# Patient Record
Sex: Female | Born: 1985 | Race: White | Hispanic: No | Marital: Single | Smoking: Former smoker
Health system: Southern US, Community
[De-identification: ages and names within clinical notes are randomized; demographics above are authoritative.]

## PROBLEM LIST (undated history)

## (undated) DIAGNOSIS — O903 Peripartum cardiomyopathy: Secondary | ICD-10-CM

## (undated) DIAGNOSIS — I10 Essential (primary) hypertension: Secondary | ICD-10-CM

## (undated) DIAGNOSIS — R079 Chest pain, unspecified: Secondary | ICD-10-CM

## (undated) DIAGNOSIS — L732 Hidradenitis suppurativa: Secondary | ICD-10-CM

## (undated) DIAGNOSIS — F419 Anxiety disorder, unspecified: Secondary | ICD-10-CM

## (undated) DIAGNOSIS — R002 Palpitations: Secondary | ICD-10-CM

## (undated) DIAGNOSIS — I5022 Chronic systolic (congestive) heart failure: Secondary | ICD-10-CM

## (undated) HISTORY — PX: OTHER SURGICAL HISTORY: SHX169

## (undated) HISTORY — DX: Chest pain, unspecified: R07.9

## (undated) HISTORY — DX: Chronic systolic (congestive) heart failure: I50.22

## (undated) HISTORY — DX: Essential (primary) hypertension: I10

## (undated) HISTORY — DX: Hidradenitis suppurativa: L73.2

## (undated) HISTORY — DX: Peripartum cardiomyopathy: O90.3

## (undated) HISTORY — DX: Palpitations: R00.2

## (undated) HISTORY — PX: DILATION AND CURETTAGE, DIAGNOSTIC / THERAPEUTIC: SUR384

---

## 2007-10-05 ENCOUNTER — Emergency Department (HOSPITAL_COMMUNITY): Admission: EM | Admit: 2007-10-05 | Discharge: 2007-10-05 | Payer: Self-pay | Admitting: Emergency Medicine

## 2008-05-13 ENCOUNTER — Inpatient Hospital Stay (HOSPITAL_COMMUNITY): Admission: AD | Admit: 2008-05-13 | Discharge: 2008-05-13 | Payer: Self-pay | Admitting: Obstetrics & Gynecology

## 2009-08-21 ENCOUNTER — Emergency Department (HOSPITAL_COMMUNITY): Admission: EM | Admit: 2009-08-21 | Discharge: 2009-08-21 | Payer: Self-pay | Admitting: Emergency Medicine

## 2009-08-21 ENCOUNTER — Ambulatory Visit: Payer: Self-pay | Admitting: Vascular Surgery

## 2009-08-21 ENCOUNTER — Encounter (INDEPENDENT_AMBULATORY_CARE_PROVIDER_SITE_OTHER): Payer: Self-pay | Admitting: Emergency Medicine

## 2009-10-31 ENCOUNTER — Inpatient Hospital Stay (HOSPITAL_COMMUNITY): Admission: AD | Admit: 2009-10-31 | Discharge: 2009-10-31 | Payer: Self-pay | Admitting: Obstetrics and Gynecology

## 2009-11-04 ENCOUNTER — Inpatient Hospital Stay (HOSPITAL_COMMUNITY): Admission: AD | Admit: 2009-11-04 | Discharge: 2009-11-07 | Payer: Self-pay | Admitting: Obstetrics and Gynecology

## 2009-11-18 ENCOUNTER — Ambulatory Visit (HOSPITAL_COMMUNITY): Admission: RE | Admit: 2009-11-18 | Discharge: 2009-11-18 | Payer: Self-pay | Admitting: Obstetrics and Gynecology

## 2009-11-18 ENCOUNTER — Ambulatory Visit: Payer: Self-pay | Admitting: Internal Medicine

## 2009-11-18 ENCOUNTER — Inpatient Hospital Stay (HOSPITAL_COMMUNITY): Admission: EM | Admit: 2009-11-18 | Discharge: 2009-11-21 | Payer: Self-pay | Admitting: Emergency Medicine

## 2009-11-19 ENCOUNTER — Encounter (INDEPENDENT_AMBULATORY_CARE_PROVIDER_SITE_OTHER): Payer: Self-pay | Admitting: Cardiovascular Disease

## 2009-11-21 ENCOUNTER — Encounter: Payer: Self-pay | Admitting: Cardiology

## 2009-11-22 ENCOUNTER — Telehealth: Payer: Self-pay | Admitting: Cardiology

## 2009-12-02 ENCOUNTER — Ambulatory Visit: Payer: Self-pay | Admitting: Cardiology

## 2009-12-02 DIAGNOSIS — I429 Cardiomyopathy, unspecified: Secondary | ICD-10-CM | POA: Insufficient documentation

## 2009-12-02 DIAGNOSIS — I10 Essential (primary) hypertension: Secondary | ICD-10-CM

## 2009-12-13 LAB — CONVERTED CEMR LAB
BUN: 23 mg/dL (ref 6–23)
Calcium: 10.2 mg/dL (ref 8.4–10.5)
Creatinine, Ser: 0.81 mg/dL (ref 0.40–1.20)

## 2009-12-26 ENCOUNTER — Ambulatory Visit: Payer: Self-pay | Admitting: Cardiology

## 2009-12-26 DIAGNOSIS — I5022 Chronic systolic (congestive) heart failure: Secondary | ICD-10-CM

## 2010-01-25 ENCOUNTER — Telehealth (INDEPENDENT_AMBULATORY_CARE_PROVIDER_SITE_OTHER): Payer: Self-pay | Admitting: *Deleted

## 2010-01-31 ENCOUNTER — Encounter: Payer: Self-pay | Admitting: Internal Medicine

## 2010-01-31 ENCOUNTER — Ambulatory Visit (HOSPITAL_COMMUNITY): Admission: RE | Admit: 2010-01-31 | Discharge: 2010-01-31 | Payer: Self-pay | Admitting: Internal Medicine

## 2010-01-31 ENCOUNTER — Telehealth: Payer: Self-pay | Admitting: Cardiology

## 2010-01-31 DIAGNOSIS — R079 Chest pain, unspecified: Secondary | ICD-10-CM

## 2010-01-31 DIAGNOSIS — R0602 Shortness of breath: Secondary | ICD-10-CM

## 2010-02-16 ENCOUNTER — Ambulatory Visit: Payer: Self-pay

## 2010-02-16 ENCOUNTER — Ambulatory Visit: Payer: Self-pay | Admitting: Cardiovascular Disease

## 2010-02-16 ENCOUNTER — Ambulatory Visit: Payer: Self-pay | Admitting: Cardiology

## 2010-02-16 ENCOUNTER — Encounter: Payer: Self-pay | Admitting: Cardiology

## 2010-02-16 ENCOUNTER — Ambulatory Visit (HOSPITAL_COMMUNITY): Admission: RE | Admit: 2010-02-16 | Discharge: 2010-02-16 | Payer: Self-pay | Admitting: Cardiology

## 2010-02-16 DIAGNOSIS — R002 Palpitations: Secondary | ICD-10-CM

## 2010-02-23 ENCOUNTER — Telehealth: Payer: Self-pay | Admitting: Cardiology

## 2010-02-27 ENCOUNTER — Telehealth: Payer: Self-pay | Admitting: Cardiology

## 2010-03-09 ENCOUNTER — Encounter: Payer: Self-pay | Admitting: Cardiology

## 2010-03-09 ENCOUNTER — Telehealth: Payer: Self-pay | Admitting: Cardiology

## 2010-03-13 ENCOUNTER — Encounter: Payer: Self-pay | Admitting: Cardiology

## 2010-04-25 ENCOUNTER — Ambulatory Visit (HOSPITAL_COMMUNITY): Admission: RE | Admit: 2010-04-25 | Discharge: 2010-04-25 | Payer: Self-pay | Admitting: Obstetrics and Gynecology

## 2010-08-22 ENCOUNTER — Encounter: Payer: Self-pay | Admitting: Cardiology

## 2010-08-22 ENCOUNTER — Ambulatory Visit (HOSPITAL_COMMUNITY)
Admission: RE | Admit: 2010-08-22 | Discharge: 2010-08-22 | Payer: Self-pay | Source: Home / Self Care | Attending: Cardiology | Admitting: Cardiology

## 2010-08-22 ENCOUNTER — Ambulatory Visit: Payer: Self-pay

## 2010-08-22 ENCOUNTER — Ambulatory Visit: Payer: Self-pay | Admitting: Cardiology

## 2010-08-24 ENCOUNTER — Telehealth: Payer: Self-pay | Admitting: Cardiology

## 2010-09-08 ENCOUNTER — Other Ambulatory Visit: Payer: Self-pay | Admitting: Cardiology

## 2010-09-08 ENCOUNTER — Ambulatory Visit
Admission: RE | Admit: 2010-09-08 | Discharge: 2010-09-08 | Payer: Self-pay | Source: Home / Self Care | Attending: Cardiology | Admitting: Cardiology

## 2010-09-08 LAB — BASIC METABOLIC PANEL
BUN: 14 mg/dL (ref 6–23)
CO2: 26 mEq/L (ref 19–32)
Calcium: 9.3 mg/dL (ref 8.4–10.5)
Chloride: 102 mEq/L (ref 96–112)
Creatinine, Ser: 0.7 mg/dL (ref 0.4–1.2)
GFR: 112.87 mL/min (ref >60.00)
Glucose, Bld: 122 mg/dL — ABNORMAL HIGH (ref 70–99)
Potassium: 4.3 mEq/L (ref 3.5–5.1)
Sodium: 137 mEq/L (ref 135–145)

## 2010-09-11 ENCOUNTER — Telehealth: Payer: Self-pay | Admitting: Cardiology

## 2010-09-13 ENCOUNTER — Encounter: Payer: Self-pay | Admitting: Cardiology

## 2010-09-24 ENCOUNTER — Encounter: Payer: Self-pay | Admitting: Cardiology

## 2010-10-03 NOTE — Medication Information (Signed)
Summary: CTA of Chest  CTA of Chest   Imported By: Debby Freiberg 03/01/2010 09:52:34  _____________________________________________________________________  External Attachment:    Type:   Image     Comment:   External Document

## 2010-10-03 NOTE — Miscellaneous (Signed)
Summary: Orders Update  Clinical Lists Changes  Orders: Added new Referral order of CT Scan  (CT Scan) - Signed  Appended Document: Orders Update CT negative. Reassurance.  Reviewed Juanito Doom, MD

## 2010-10-03 NOTE — Assessment & Plan Note (Signed)
Summary: rov pt having echo at 3pm today/sl   Visit Type:  4 mo f/u Referring Provider:  Dr Jackelyn Knife Primary Provider:  Dr Jackelyn Knife  CC:  palpitations....denies any cp or edema or sob.  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  Since her last visit, she has gained some strength. She still gets a little winded when she pushes herself too hard. She has palpitations that are intermittent and not predictable. They occur usually at rest. She is on a syncope or presyncope. She denies any chest pain. She has had no orthopnea, PND, or edema.    Current Medications (verified): 1)  Coreg 6.25 Mg Tabs (Carvedilol) .[name].. 1 Two Times A Day 2)  Furosemide 20 Mg Tabs (Furosemide) .... Take One Tablet By Mouth Daily. 3)  Lisinopril 10 Mg Tabs (Lisinopril) .[name].. 1 Once Daily 4)  Tetracycline Hcl 500 Mg Caps (Tetracycline Hcl) .[name].. 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   Risk Factors: Exercise: no (12/02/2009)  Risk Factors: Smoking Status: never (12/02/2009)  Review of Systems       negative history of present illness  Vital Signs:  Patient profile:   25 year old female Height:      69 inches Weight:      162 pounds BMI:      24.01 Pulse rate:   77 / minute Pulse rhythm:   regular BP sitting:   106 / 60  (left arm) Cuff size:   large  Vitals Entered By: Danielle Rankin, CMA (February 16, 2010 4:11 PM)  Physical Exam  General:  Well developed, well nourished, in no acute distress. Head:  normocephalic and atraumatic Eyes:  PERRLA/EOM intact; conjunctiva and lids normal. Neck:  Neck supple, no JVD. No masses, thyromegaly or abnormal cervical nodes. Chest Shelby Smith:  no deformities or breast masses noted Lungs:  Clear bilaterally to auscultation and percussion. Heart:  and her PMI nondisplaced, regular rate and rhythm, normal S1-S2, soft systolic murmur at the left lower sternal border, no gallop Msk:  Back normal, normal gait. Muscle strength and tone normal. Pulses:  pulses normal in all 4 extremities Extremities:  No clubbing or cyanosis. Neurologic:  Alert and oriented x 3. Skin:  Intact without lesions or rashes. Psych:  Normal affect.   Problems:  Medical Problems Added: 1)  Dx of Palpitations  (ICD-785.1)  Impression & Recommendations:  Problem # 1:  CARDIOMYOPATHY, SECONDARY (ICD-425.9) Assessment Improved Preliminary echocardiogram shows the heart is now normal in size. This is encouraging. Final read is pending.  Hopefully, her ejection fraction has improved. Her updated medication list for this problem includes:    Coreg 6.25 Mg Tabs (Carvedilol) .[name]... 1 two times a day    Furosemide 20 Mg Tabs (Furosemide) .[name]... Take one tablet by mouth daily.    Lisinopril 10 Mg Tabs (Lisinopril) .Marland KitchenMarland KitchenMarland KitchenMarland Kitchen 1  once daily  Problem # 2:  HYPERTENSION, UNSPECIFIED (ICD-401.9) Assessment: Improved  Her updated medication list for this problem includes:    Coreg 6.25 Mg Tabs (Carvedilol) .[name]... 1 two times a day    Furosemide 20 Mg Tabs (Furosemide) .[name]... Take one tablet by mouth daily.    Lisinopril 10 Mg Tabs (Lisinopril) .[name]... 1 once daily  Problem # 3:  PALPITATIONS (ICD-785.1) with her history of a cardiomyopathy,  will obtain an event recorder to any significant arrhythmia. Electrolytes have been normal in the past so I'll not repeat those. Her updated medication list for this problem includes:    Coreg 6.25 Mg Tabs (Carvedilol) .[name]... 1 two times a day    Lisinopril 10 Mg Tabs (Lisinopril) .[name]... 1 once daily  Orders: EKG w/ Interpretation (93000) Event (Event)  Patient Instructions: 1)  Your physician recommends that you schedule a follow-up appointment in: 6 MONTHS WITH DR Aaren Atallah 2)  Your physician recommends that you continue on your current medications as directed. Please refer to the Current Medication list given to you today. 3)  Your physician has recommended that you wear an event monitor.  Event monitors are medical devices that record the heart's electrical activity. Doctors most often use these monitors to diagnose arrhythmias. Arrhythmias are problems with the speed or rhythm of the heartbeat. The monitor is a small, portable device. You can wear one while you do your normal daily activities. This is usually used to diagnose what is causing palpitations/syncope (passing out).

## 2010-10-03 NOTE — Assessment & Plan Note (Signed)
Summary: eph/ gd   Visit Type:  post hospital Referring Provider:  Dr Jackelyn Knife Primary Provider:  Dr Jackelyn Knife  CC:  palpitations.  History of Present Illness: [name] comes in today for evaluation and management of a postpartum cardiomyopathy.  Please see the recent history and physical and discharge summary.  Since discharge, she denies orthopnea, PND, dyspnea on exertion. She is having significant fatigue. In addition she's had headaches which are generalized. She says she's had these ever since the labetalol was injected in the hospital. These have not stopped since she went home.  She's had some heart fluttering but no presyncope or syncope. These most commonly happened at night when she lies in bed.  Her baby Shelby Smith is doing remarkably well. She brought her to the clinic today.  Preventive Screening-Counseling & Management  Alcohol-Tobacco     Smoking Status: never  Caffeine-Diet-Exercise     Does Patient Exercise: no  Current Medications (verified): 1)  Labetalol Hcl 200 Mg Tabs (Labetalol Hcl) .... Take One Tablet By Mouth Twice A Day 2)  Furosemide 20 Mg Tabs (Furosemide) .... Take One Tablet By Mouth Daily. 3)  Multivitamins   Tabs (Multiple Vitamin) .... 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   Risk Factors: Exercise: no (12/02/2009)  Risk Factors: Smoking Status: never (12/02/2009)  Past Surgical History: D&C R ear surgery (eardrum  repair) Rt kneww lateral release  Social History: Full Time Alcohol Use - yes -- rarely Drug Use - no Single  Regular Exercise - no Tobacco Use - No.  Does Patient Exercise:  no Smoking Status:  never  Review of Systems       negative other than history of present illness  Vital Signs:  Patient profile:   25 year old female Height:      69 inches Weight:      158 pounds BMI:     23.42 Pulse rate:   65 / minute BP sitting:   104 / 76  (left arm) Cuff size:   regular  Vitals Entered By: Hardin Negus, RMA (December 02, 2009 3:05 PM)  Physical Exam  General:  Well developed, well nourished, in no acute distress. she looks very tired Head:  normocephalic and atraumatic Eyes:  PERRLA/EOM intact; conjunctiva and lids normal. Neck:  Neck supple, no JVD. No masses, thyromegaly or abnormal cervical nodes. Chest Regan Mcbryar:  no deformities or breast masses noted Lungs:  Clear bilaterally to auscultation and percussion. Heart:  PMI displaced inferolaterally, regular rate and rhythm, no gallop or rub Abdomen:  Bowel sounds positive; abdomen soft and non-tender without masses, organomegaly, or hernias noted. No hepatosplenomegaly. Msk:  Back normal, normal gait. Muscle strength and tone normal. Pulses:  pulses normal in all 4 extremities Extremities:  No clubbing or cyanosis. Neurologic:  Alert and oriented x 3. Skin:  Intact without lesions or rashes. Psych:  Normal affect.   Impression & Recommendations:  Problem # 1:  CARDIOMYOPATHY, SECONDARY (ICD-425.9) Assessment Unchanged At the present time she is compensated. She is on an inappropriate program because  her breast-feeding. After long talk with her today about the importance of taking care of herself first, so she can take care of Valorie, she has decided to let me change her medications and stop breast-feeding.  We'll discontinue labetalol. We'll continue furosemide for now. I Will begancarvedilol 6.25 mg p.o. b.i.d. and  low-dose lisinopril 10 mg per day. We will check a set of electrolytes today. Salt restriction again emphasized.I will see her back for close followup in 4 weeks. We will repeat a 2-D echocardiogram 3 months after her presentation. Her updated medication list for this problem includes:    Coreg 6.25 Mg Tabs (Carvedilol) .[name]... 1 two times a day    Furosemide 20 Mg Tabs (Furosemide) .[name]... Take one tablet by mouth daily.    Lisinopril 10 Mg Tabs (Lisinopril) .[name]... 1 once daily  Problem # 2:  HYPERTENSION, UNSPECIFIED (ICD-401.9) Assessment: Improved  Her updated medication list for this problem includes:    Coreg 6.25 Mg Tabs (Carvedilol) .[name]... 1 two times a day    Furosemide 20 Mg Tabs (Furosemide) .[name]... Take one tablet by mouth daily.    Lisinopril 10 Mg Tabs (Lisinopril) .[name]... 1 once daily  Her updated medication list for this problem includes:    Labetalol Hcl 200 Mg Tabs (Labetalol hcl) .[name]... Take one tablet by mouth twice a day    Furosemide 20 Mg Tabs (Furosemide) .[name]... Take one tablet by mouth daily.  Problem # 3:  SYSTOLIC HEART FAILURE, ACUTE (ICD-428.21) Assessment: Improved  Her updated medication list for this problem includes:    Coreg 6.25 Mg Tabs (Carvedilol) .[name]... 1 two times a day    Furosemide 20 Mg Tabs (Furosemide) .[name]... Take one tablet by mouth daily.    Lisinopril 10 Mg Tabs (Lisinopril) .[name]... 1 once daily  Her updated medication list for this problem includes:    Labetalol Hcl 200 Mg Tabs (Labetalol hcl) .[name]... Take one tablet by mouth twice a day    Furosemide 20 Mg Tabs (Furosemide) .[name]... Take one tablet by mouth daily.  Orders: TLB-BMP (Basic Metabolic Panel-BMET) (80048-METABOL)  Patient Instructions: 1)  Your physician recommends that you schedule a follow-up appointment in: 4 WEEKS WITH DR Mariana Wiederholt 2)  Your physician recommends that you return for lab work VO:ZDGUY BMET 425.9 3)  Your physician has recommended you make the following change in your medication:  STOP LABATELOL 4)  START CARVEDILOL 6.25 MG two times a day  5)  LISINOPRIL 10 MG once daily Prescriptions: LISINOPRIL 10 MG TABS (LISINOPRIL) 1 once daily  #30 x 11   Entered by:   Scherrie Bateman, LPN   Authorized by:   Gaylord Shih, MD, [facility]   Signed by:   Scherrie Bateman, LPN on 40/34/7425   Method used:   Electronically to        [facility]  [address] [phone]* (retail)       [address] [place], [place]  [address]       Ph: [phone] or [phone]       Fax: [phone]   RxID:   [id number] COREG 6.25 MG TABS (CARVEDILOL) 1 two times a day  #60 x 11   Entered by:   Scherrie Bateman, LPN   Authorized by:   Gaylord Shih, MD, [facility]   Signed by:   Scherrie Bateman, LPN on 15/17/6160   Method used:   Electronically to  [facility]  [address] [phone]* (retail)       [address]       [place], [place]  [address]       Ph: [phone] or [phone]       Fax: [phone]   RxID:   [id number]

## 2010-10-03 NOTE — Assessment & Plan Note (Signed)
Summary: per check out/sf   Referring Provider:  Dr Jackelyn Knife Primary Provider:  Dr Jackelyn Knife   History of Present Illness: Shelby Smith returns today for evaluation and management of her secondary cardiomyopathy.  She's tolerating the lisinopril and carvedilol which restart her last visit. She occasionally has some orthostatic symptoms but is rare. She does get short of breath and develops some tachycardia symptoms when she goes up hill. She denies orthopnea or PND. She's had no peripheral edema. He states he very compliant with her medications.  Current Medications (verified): 1)  Coreg 6.25 Mg Tabs (Carvedilol) .[name].. 1 Two Times A Day 2)  Furosemide 20 Mg Tabs (Furosemide) .... Take One Tablet By Mouth Daily. 3)  Multivitamins   Tabs (Multiple Vitamin) .... Once Daily 4)  Lisinopril 10 Mg Tabs (Lisinopril) .[name].. 1 Once Daily 5)  Tetracycline Hcl 500 Mg Caps (Tetracycline Hcl) .[name].. 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   Risk Factors: Exercise: no (12/02/2009)  Risk Factors: Smoking Status: never (12/02/2009)  Review of Systems       negative history of present illness  Vital Signs:  Patient profile:   25 year old female Height:      69 inches Weight:      161 pounds Pulse rate:   77 / minute BP sitting:   100 / 60  (left arm)  Vitals  Entered By: Laurance Flatten CMA (December 26, 2009 4:32 PM)  Physical Exam  General:  mildly overweight, in no acute distress Head:  normocephalic and atraumatic Eyes:  PERRLA/EOM intact; conjunctiva and lids normal. Neck:  Neck supple, no JVD. No masses, thyromegaly or abnormal cervical nodes. Chest Vennie Waymire:  no deformities or breast masses noted Lungs:  Clear bilaterally to auscultation and percussion. Heart:  nS1-S2, no gallop, regular rate and rhythm Msk:  Back normal, normal gait. Muscle strength and tone normal. Pulses:  pulses normal in all 4 extremities Extremities:  No clubbing or cyanosis. Neurologic:  Alert and oriented x 3. Skin:  Intact without lesions or rashes. Psych:  Normal affect.   Problems:  Medical Problems Added: 1)  Dx of Systolic Heart Failure, Chronic  (ICD-428.22)  Impression & Recommendations:  Problem # 1:  CARDIOMYOPATHY, SECONDARY (ICD-425.9) She is stable and gaining strength and stamina. I suspect her LV function has improved. She still lightheaded with standing. Will not change her medications today. I will repeat her echocardiogram in June. Hopefully, we'll see a significant improvement in LV function. If not, will add spironolactone. Her updated medication list for this problem includes:    Coreg 6.25 Mg Tabs (Carvedilol) .[name]... 1 two times a day    Furosemide 20 Mg Tabs (Furosemide) .[name]... Take one tablet by mouth daily.    Lisinopril 10 Mg Tabs (Lisinopril) .[name]... 1 once daily  Problem # 2:  HYPERTENSION, UNSPECIFIED (ICD-401.9) Assessment: Improved  Her updated medication list for this problem includes:    Coreg 6.25 Mg Tabs (  Carvedilol) .[name]... 1 two times a day    Furosemide 20 Mg Tabs (Furosemide) .[name]... Take one tablet by mouth daily.    Lisinopril 10 Mg Tabs (Lisinopril) .[name]... 1 once daily  Problem # 3:  SYSTOLIC HEART FAILURE, CHRONIC (ICD-428.22) Assessment: Unchanged  Her updated medication list for this problem includes:    Coreg 6.25 Mg  Tabs (Carvedilol) .[name]... 1 two times a day    Furosemide 20 Mg Tabs (Furosemide) .[name]... Take one tablet by mouth daily.    Lisinopril 10 Mg Tabs (Lisinopril) .[name]... 1 once daily  Orders: Echocardiogram (Echo)  Patient Instructions: 1)  Your physician recommends that you schedule a follow-up appointment in: JUNE 2011 ECHO SAME DAY  2)  Your physician recommends that you continue on your current medications as directed. Please refer to the Current Medication list given to you today. 3)  Your physician has requested that you have an echocardiogram.  Echocardiography is a painless test that uses sound waves to create images of your heart. It provides your doctor with information about the size and shape of your heart and how well your heart's chambers and valves are working.  This procedure takes approximately one hour. There are no restrictions for this procedure.SEE DR Naasir Carreira SAME DAY

## 2010-10-03 NOTE — Progress Notes (Signed)
Summary: pt having headaches  Phone Note Call from Patient Call back at [phone]   Caller: Patient Reason for Call: Talk to Nurse, Talk to Doctor Summary of Call: pt is still having headaches and was wondering what was gonna be done about it Initial call taken by: Omer Jack,  November 22, 2009 3:36 PM  Follow-up for Phone Call        lmtcb Scherrie Bateman, LPN  November 22, 2009 3:40 PM  per pt calling back [phone] Lorne Skeens  November 22, 2009 3:44 PM  PT C/O H/A SINCE STARTING LABETALOL 200MG  two times a day IS CURRENTLY BREASTFEEDING  B/P TODAY WAS  109/75 HAS TRIED TAKING TYLENOL  IN ADDITION WITH LITTLE RELIEF. PLEASE ADVISE. Follow-up by: Scherrie Bateman, LPN,  November 23, 2009 12:34 PM  Additional Follow-up for Phone Call Additional follow up Details #1::        TRY TO TOLERATE IF SHE CAN. IF SHE STOPS THERE IS NO GOOD CHOICE IF BREAT FEEDING. i WOULD REC NO BREAST FEEDING. Additional Follow-up by: Gaylord Shih, MD, [facility],  November 23, 2009 2:31 PM     Appended Document: pt having headaches PT AWARE LABETALOL ONLY MED CAN TAKE WHILE BREASTFEEDING  INSISTS ON BREASTFEEDING INSTRUCTED TO CONT  TO TAKE LABETALOL AND TYL IN ADDITION  TO HELP WITH H/A WILL CALL IF NO IMPROVEMENT.

## 2010-10-03 NOTE — Letter (Signed)
Summary: Generic Letter  Architectural technologist, Main Office  [address]   [place], [place] [address]   Phone: [phone]  Fax: [phone]        March 09, 2010 MRN: [id number]    [facility] [address] [place], [place]  [address]   ABOVE NAMED PT HAS NO MEDICATION RESTRICTIONS. MAY TAKE PAIN MEDICATIONS.        Sincerely, DR TOM Maritza [place]/ Scherrie Bateman, LPN  This letter has been electronically signed by your physician.

## 2010-10-03 NOTE — Progress Notes (Signed)
Summary: lips purple/SOB/Dizzy with pain spiral CT  Phone Note Call from Patient Call back at Home Phone [phone] Call back at [phone]   Caller: Patient Reason for Call: Talk to Nurse Summary of Call: per pt calling, c/o under left rib cage during holding child.  Initial call taken by: Lorne Skeens,  Jan 31, 2010 12:22 PM  Follow-up for Phone Call        [facility] for call back [phone]. Other # will not take messages.  Additional Follow-up for Phone Call Additional follow up Details #1::        doesnt sound cardiac. no treatment necessary. Additional Follow-up by: Gaylord Shih, MD, [facility],  Jan 31, 2010 12:45 PM     Appended Document: c/o pain under left rib cage/ [facility] for CB spoke with pt who states she is having pain under the left rib cage.  The pain is sharp and lasts approximatley 30 mins after sitting down.  She becomes dizzy and SOB when this occurs and reports that her lips are blue/purple looking.  She reports never having anything like this before.  Would like to be seen ASAP.   Pt does have an echo and follow up appt 02/16/2010 with Dr Daleen Squibb.  Will forward to MD to see if pt needs to be seen sooner than 6/16 as she is not having the s/s at this time.  Appended Document: lips purple/SOB/Dizzy with pain reviewed with Dr Lewayne Bunting in Dr Vern Claude absence - spiral CT scan ordered - pt aware.  In speaking with the pt, she states this happens most every time she gets up for any extended amount of time like up washing dishes or trying to get to the grocery store.  She went yesterday and couldn't stay for more than 10 mins d/t s/s.  Appended Document: lips purple/SOB/Dizzy with pain spiral CT pt scheduled for 02/01/210 at 12N, pt aware of time and instructions.  Appended Document: lips purple/SOB/Dizzy with pain spiral CT  Reviewed Juanito Doom, MD

## 2010-10-03 NOTE — Letter (Signed)
Summary: [facility]  [id number]   Imported By: Marylou Mccoy 04/20/2010 10:36:20  _____________________________________________________________________  External Attachment:    Type:   Image     Comment:   External Document

## 2010-10-03 NOTE — Progress Notes (Signed)
Summary: pt needs new letter sent  Phone Note Call from Patient Call back at [phone]   Caller: Patient Reason for Call: Talk to Nurse, Talk to Doctor Summary of Call: the pain ctr needs another letter faxed over stating she has no medication restrictions cause the last did not say that Initial call taken by: Omer Jack,  March 09, 2010 2:18 PM  Follow-up for Phone Call        LETTER DONE SEE . Follow-up by: Scherrie Bateman, LPN,  March 09, 980 4:46 PM

## 2010-10-03 NOTE — Progress Notes (Signed)
Summary: echo results-checking on fax  Phone Note Call from Patient   Caller: Patient [phone] Reason for Call: Talk to Nurse Summary of Call: pt calling to see if fax to [facility] has been faxed-also would like echo results-pls call [phone] Initial call taken by: Glynda Jaeger,  February 27, 2010 12:37 PM  Follow-up for Phone Call        [facility] Scherrie Bateman, LPN  February 28, 2010 8:32 AM  SPOKE WITH PT NEEDS LETTER SAYING OKAY TO HAVE PAIN MEDS PRESCRIBED . IS THISOKAY? Follow-up by: Scherrie Bateman, LPN,  February 28, 2010 8:38 AM  Additional Follow-up for Phone Call Additional follow up Details #1::        ok to have pain meds but not prescribed by me. Additional Follow-up by: Gaylord Shih, MD, [facility],  March 02, 2010 10:55 AM

## 2010-10-03 NOTE — Progress Notes (Signed)
Summary: Question about a letter for Shelby Smith  Phone Note Call from Patient Call back at Home Phone [phone] Call back at [phone]   Caller: Patient Summary of Call: Pt calling regarding a letter that Dr.Kert Shackett was to fax to [facility] Initial call taken by: Judie Grieve,  February 23, 2010 2:18 PM  Follow-up for Phone Call        ATTEMPTED TO CALL PT UNABLE TO LEAVE  MESSAGE WILL TRY AGAIN ON MON. Follow-up by: Scherrie Bateman, LPN,  February 23, 2010 5:00 PM  Additional Follow-up for Phone Call Additional follow up Details #1::        Fax whatever she needs. Additional Follow-up by: Gaylord Shih, MD, [facility],  February 27, 2010 11:46 AM

## 2010-10-03 NOTE — Progress Notes (Signed)
  Faxed All Cardiac over to [facility] to [phone] [facility]  Jan 25, 2010 8:43 AM

## 2010-10-03 NOTE — Miscellaneous (Signed)
Summary: Orders Update  Clinical Lists Changes  Problems: Added new problem of CHEST PAIN, LEFT (ICD-786.50) Added new problem of DYSPNEA (ICD-786.05) Orders: Added new Referral order of CT Scan  (CT Scan) - Signed

## 2010-10-05 NOTE — Progress Notes (Signed)
Summary: echo results  Phone Note Call from Patient Call back at Home Phone [phone]   Caller: Patient Reason for Call: Talk to Nurse Summary of Call: calling re echo results Initial call taken by: Roe Coombs,  August 24, 2010 11:27 AM  Follow-up for Phone Call        Pt is aware of ECHO results. Mylo Red RN

## 2010-10-05 NOTE — Progress Notes (Signed)
Summary: pt needs note for work  Phone Note Call from Patient   Caller: Patient [phone] Reason for Call: Talk to Nurse Summary of Call: pt calling re needing a note for work re her heart condition-her employer wants her to work about 60 hrs and she doesn't feel with her heart condition that she can do this and needs a note explaining this and why faxed to [phone] att Laverle Randon  Initial call taken by: Glynda Jaeger,  September 11, 2010 4:30 PM  Follow-up for Phone Call        09/11/10--1645--Pt calling stating she would like a note for work stating that with a heart condition and a baby she cannot work as many hours at her job--advised i would let dr Daijha Leggio and his nurse know and they can f/u about her note--nt Follow-up by: Ledon Snare, RN,  September 11, 2010 4:47 PM     Appended Document: pt needs note for work send note to limit work to 40hrs a week, she is not disabled.

## 2010-10-05 NOTE — Assessment & Plan Note (Signed)
Summary: 6 mo f/u ./cy   Visit Type:  6 mo f/u Referring Kira Hartl:  Dr Jackelyn Knife Primary Mckinzey Entwistle:  Dr Jackelyn Knife  CC:  fatigue at times...edema/feet...pt states she has chest discomfort at times says it feels like her bra is too tight...denies any leg pain.  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  She is having no symptoms of orthopnea, PND or very little edema. She's had no chest pain. Her dyspnea and exertion is improved. Her palpitations have also improved. She still has her generalized fatigue.  She went to the heart failure service for second opinion at [facility]. Please see the note by Dr Glori Luis. No changes in recommendation were made.  Current Medications (verified): 1)  Coreg 6.25 Mg Tabs (Carvedilol) .[name].. 1 Two Times A Day 2)  Furosemide 20 Mg Tabs (Furosemide) .... Take One Tablet By Mouth Daily. 3)  Lisinopril 10 Mg Tabs (Lisinopril) .[name].. 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   Risk Factors: Exercise: no (12/02/2009)  Risk Factors: Smoking Status: never (12/02/2009)  Review of Systems       negative history of present illness  Vital Signs:  Patient profile:   25 year old female Height:      69 inches Weight:      160.50 pounds BMI:      23.79 Pulse rate:   80 / minute Pulse rhythm:   irregular BP sitting:   100 / 68  (left arm) Cuff size:   large  Vitals Entered By: Danielle Rankin, CMA (August 22, 2010 4:27 PM)  Physical Exam  General:  Well developed, well nourished, in no acute distress. Head:  normocephalic and atraumatic Eyes:  PERRLA/EOM intact; conjunctiva and lids normal. Neck:  Neck supple, no JVD. No masses, thyromegaly or abnormal cervical nodes. Chest Wall:  no deformities or breast masses noted Lungs:  Clear bilaterally to auscultation and percussion. Heart:  PMI not displaced, normal S1-S2, no gallop Msk:  Back normal, normal gait. Muscle strength and tone normal. Pulses:  pulses normal in all 4 extremities Extremities:  No clubbing or cyanosis. Neurologic:  Alert and oriented x 3. Skin:  Intact without lesions or rashes. Psych:  Normal affect.   Impression & Recommendations:  Problem # 1:  SYSTOLIC HEART FAILURE, CHRONIC (ICD-428.22) Echocardiogram pending. With very little edema, we will start spironolactone 25 mg per day and use the Lasix only p.r.n. Hopefully, she will have less fatigue. She's been instructed away her self daily. She is very careful salt. Her updated medication list for this problem includes:    Coreg 6.25 Mg Tabs (Carvedilol) .[name]... 1 two times a day    Furosemide 20 Mg Tabs (Furosemide) .[name]... Take 1 tablet as needed for weight gain over 2 pounds or swelling in the morning.    Lisinopril 10 Mg Tabs (Lisinopril) .[name]... 1 once daily    Spironolactone 25 Mg  Tabs (Spironolactone) .[name]... Take one tablet by mouth every morning.  Orders: EKG w/ Interpretation (93000) TLB-BMP (Basic Metabolic Panel-BMET) (80048-METABOL)  Problem # 2:  CARDIOMYOPATHY, SECONDARY (ICD-425.9) Assessment: Unchanged  Her updated medication list for this problem includes:    Coreg 6.25 Mg Tabs (Carvedilol) .[name]... 1 two times a day    Furosemide 20 Mg Tabs (Furosemide) .[name]... Take 1 tablet as needed for  weight gain over 2 pounds or swelling in the morning.    Lisinopril 10 Mg Tabs (Lisinopril) .[name]... 1 once daily    Spironolactone 25 Mg Tabs (Spironolactone) .[name]... Take one tablet by mouth every morning.  Orders: EKG w/ Interpretation (93000) TLB-BMP (Basic Metabolic Panel-BMET) (80048-METABOL)  Problem # 3:  PALPITATIONS (ICD-785.1) Assessment: Improved  Her updated medication list for this problem includes:    Coreg 6.25 Mg Tabs (Carvedilol) .[name]... 1 two times a day    Lisinopril 10 Mg Tabs (Lisinopril) .[name]... 1 once daily  Orders: EKG w/ Interpretation (93000) TLB-BMP (Basic Metabolic Panel-BMET) (80048-METABOL)  Problem # 4:  DYSPNEA (ICD-786.05) Assessment: Improved  Her updated medication list for this problem includes:    Coreg 6.25 Mg Tabs (Carvedilol) .[name]... 1 two times a day    Furosemide 20 Mg Tabs (Furosemide) .[name]... Take 1 tablet as needed for weight gain over 2 pounds or swelling in the morning.    Lisinopril 10 Mg Tabs (Lisinopril) .[name]... 1 once daily    Spironolactone 25 Mg Tabs (Spironolactone) .[name]... Take one tablet by mouth every morning.  Problem # 5:  HYPERTENSION, UNSPECIFIED (ICD-401.9) Assessment: Improved  Her updated medication list for this problem includes:    Coreg 6.25 Mg Tabs (Carvedilol) .[name]... 1 two times a day    Furosemide 20 Mg Tabs (Furosemide) .[name]... Take 1 tablet as needed for weight gain over 2 pounds or swelling in the morning.    Lisinopril 10 Mg Tabs (Lisinopril) .[name]... 1 once daily    Spironolactone 25 Mg Tabs (Spironolactone) .[name]... Take one tablet by mouth every morning.  Patient Instructions: 1)  Your physician recommends that you schedule a follow-up appointment in: 6 months with Dr. Daleen Squibb 2)  Your physician recommends that you have lab work today: BMET 3)  Your physician has recommended you make the following change in your medication:  Prescriptions: SPIRONOLACTONE 25 MG TABS (SPIRONOLACTONE) Take one tablet by mouth every morning.  #30  x 6   Entered by:   Lisabeth Devoid RN   Authorized by:   Gaylord Shih, MD, [facility]   Signed by:   Lisabeth Devoid RN on 08/22/2010   Method used:   Electronically to        [facility]  [address] [phone]* (retail)       [address]       [place], [place]  [address]       Ph: [phone] or [phone]       Fax: [phone]   RxID:   [id number]

## 2010-10-05 NOTE — Letter (Signed)
Summary: Return To Work  [employer], Main Office  [address]   [place], [place] [address]   Phone: [phone]  Fax: [phone]    09/13/2010  TO: Leodis Sias IT MAY CONCERN   RE: Shelby Smith [address] [id number]   The above named individual is under my medical care and may return to work.  From a cardiac standpoint it would be advised that she work no more than 40 hours per week.  If you have any further questions or need additional information, please call.     Sincerely,   Dr. Valera Castle Shelby Red RN    Lisabeth Devoid RN

## 2010-10-10 ENCOUNTER — Other Ambulatory Visit: Payer: Self-pay | Admitting: Anesthesiology

## 2010-10-10 ENCOUNTER — Ambulatory Visit
Admission: RE | Admit: 2010-10-10 | Discharge: 2010-10-10 | Disposition: A | Discharge status: Home / Self Care | Payer: BC Managed Care – PPO | Source: Ambulatory Visit | Attending: Anesthesiology | Admitting: Anesthesiology

## 2010-10-10 DIAGNOSIS — M549 Dorsalgia, unspecified: Secondary | ICD-10-CM

## 2010-11-21 ENCOUNTER — Other Ambulatory Visit: Payer: Self-pay | Admitting: *Deleted

## 2010-11-21 DIAGNOSIS — I5022 Chronic systolic (congestive) heart failure: Secondary | ICD-10-CM

## 2010-11-21 MED ORDER — LISINOPRIL 10 MG PO TABS: 10.0000 mg | ORAL_TABLET | Freq: Every day | ORAL | Status: DC

## 2010-11-22 LAB — COMPREHENSIVE METABOLIC PANEL
ALT: 15 U/L (ref 0–35)
AST: 16 U/L (ref 0–37)
Alkaline Phosphatase: 137 U/L — ABNORMAL HIGH (ref 39–117)
CO2: 23 mEq/L (ref 19–32)
Calcium: 8.8 mg/dL (ref 8.4–10.5)
Chloride: 104 mEq/L (ref 96–112)
GFR calc Af Amer: >60 mL/min (ref >60)
GFR calc non Af Amer: >60 mL/min (ref >60)
Glucose, Bld: 85 mg/dL (ref 70–99)
Potassium: 2.9 mEq/L — ABNORMAL LOW (ref 3.5–5.1)
Sodium: 134 mEq/L — ABNORMAL LOW (ref 135–145)
Total Bilirubin: 0.3 mg/dL (ref 0.3–1.2)

## 2010-11-22 LAB — CBC
Hemoglobin: 10.2 g/dL — ABNORMAL LOW (ref 12.0–15.0)
RBC: 3.36 MIL/uL — ABNORMAL LOW (ref 3.87–5.11)
WBC: 12.8 10*3/uL — ABNORMAL HIGH (ref 4.0–10.5)

## 2010-11-26 LAB — URINALYSIS, ROUTINE W REFLEX MICROSCOPIC
Bilirubin Urine: NEGATIVE
Glucose, UA: NEGATIVE mg/dL
Specific Gravity, Urine: 1.012 (ref 1.005–1.030)
pH: 6.5 (ref 5.0–8.0)

## 2010-11-26 LAB — COMPREHENSIVE METABOLIC PANEL
ALT: 21 U/L (ref 0–35)
AST: 21 U/L (ref 0–37)
Albumin: 2.7 g/dL — ABNORMAL LOW (ref 3.5–5.2)
Albumin: 3.1 g/dL — ABNORMAL LOW (ref 3.5–5.2)
Alkaline Phosphatase: 114 U/L (ref 39–117)
BUN: 18 mg/dL (ref 6–23)
BUN: 6 mg/dL (ref 6–23)
CO2: 26 mEq/L (ref 19–32)
Calcium: 9.1 mg/dL (ref 8.4–10.5)
Calcium: 9.2 mg/dL (ref 8.4–10.5)
Chloride: 111 mEq/L (ref 96–112)
Creatinine, Ser: 0.51 mg/dL (ref 0.4–1.2)
Creatinine, Ser: 0.71 mg/dL (ref 0.4–1.2)
GFR calc Af Amer: >60 mL/min (ref >60)
GFR calc non Af Amer: >60 mL/min (ref >60)
GFR calc non Af Amer: >60 mL/min (ref >60)
GFR calc non Af Amer: >60 mL/min (ref >60)
Glucose, Bld: 95 mg/dL (ref 70–99)
Potassium: 3.3 mEq/L — ABNORMAL LOW (ref 3.5–5.1)
Total Bilirubin: 0.2 mg/dL — ABNORMAL LOW (ref 0.3–1.2)
Total Bilirubin: 0.7 mg/dL (ref 0.3–1.2)
Total Protein: 5.2 g/dL — ABNORMAL LOW (ref 6.0–8.3)

## 2010-11-26 LAB — CARDIAC PANEL(CRET KIN+CKTOT+MB+TROPI)
Total CK: 51 U/L (ref 7–177)
Total CK: 69 U/L (ref 7–177)

## 2010-11-26 LAB — URINE CULTURE: Colony Count: 50000

## 2010-11-26 LAB — URINE MICROSCOPIC-ADD ON

## 2010-11-26 LAB — CBC
HCT: 28.5 % — ABNORMAL LOW (ref 36.0–46.0)
HCT: 29.3 % — ABNORMAL LOW (ref 36.0–46.0)
HCT: 31.1 % — ABNORMAL LOW (ref 36.0–46.0)
Hemoglobin: 9.5 g/dL — ABNORMAL LOW (ref 12.0–15.0)
MCHC: 33.2 g/dL (ref 30.0–36.0)
MCHC: 33.8 g/dL (ref 30.0–36.0)
MCV: 90.5 fL (ref 78.0–100.0)
MCV: 91.1 fL (ref 78.0–100.0)
MCV: 91.3 fL (ref 78.0–100.0)
Platelets: 173 10*3/uL (ref 150–400)
Platelets: 315 10*3/uL (ref 150–400)
RBC: 3.12 MIL/uL — ABNORMAL LOW (ref 3.87–5.11)
RBC: 3.21 MIL/uL — ABNORMAL LOW (ref 3.87–5.11)
RDW: 15.3 % (ref 11.5–15.5)
WBC: 14.1 10*3/uL — ABNORMAL HIGH (ref 4.0–10.5)
WBC: 14.8 10*3/uL — ABNORMAL HIGH (ref 4.0–10.5)

## 2010-11-26 LAB — DIFFERENTIAL
Basophils Absolute: 0.3 10*3/uL — ABNORMAL HIGH (ref 0.0–0.1)
Lymphocytes Relative: 17 % (ref 12–46)
Monocytes Absolute: 0.5 10*3/uL (ref 0.1–1.0)
Neutro Abs: 10.4 10*3/uL — ABNORMAL HIGH (ref 1.7–7.7)

## 2010-11-26 LAB — PROTIME-INR: INR: 1.08 (ref 0.00–1.49)

## 2010-11-26 LAB — URIC ACID
Uric Acid, Serum: 3.7 mg/dL (ref 2.4–7.0)
Uric Acid, Serum: 4 mg/dL (ref 2.4–7.0)

## 2010-11-26 LAB — LACTATE DEHYDROGENASE: LDH: 161 U/L (ref 94–250)

## 2010-11-26 LAB — BASIC METABOLIC PANEL
BUN: 19 mg/dL (ref 6–23)
CO2: 25 mEq/L (ref 19–32)
CO2: 25 mEq/L (ref 19–32)
Calcium: 8.7 mg/dL (ref 8.4–10.5)
Chloride: 106 mEq/L (ref 96–112)
Creatinine, Ser: 0.74 mg/dL (ref 0.4–1.2)
Creatinine, Ser: 0.91 mg/dL (ref 0.4–1.2)
GFR calc Af Amer: >60 mL/min (ref >60)
Glucose, Bld: 82 mg/dL (ref 70–99)
Glucose, Bld: 97 mg/dL (ref 70–99)

## 2010-11-26 LAB — CK TOTAL AND CKMB (NOT AT ARMC): Relative Index: INVALID (ref 0.0–2.5)

## 2010-11-26 LAB — LIPID PANEL
LDL Cholesterol: 188 mg/dL — ABNORMAL HIGH (ref 0–99)
Triglycerides: 112 mg/dL (ref <150)
VLDL: 22 mg/dL (ref 0–40)

## 2010-11-26 LAB — APTT: aPTT: 33 seconds (ref 24–37)

## 2010-11-26 LAB — HEPATIC FUNCTION PANEL
Albumin: 3.3 g/dL — ABNORMAL LOW (ref 3.5–5.2)
Alkaline Phosphatase: 84 U/L (ref 39–117)
Total Bilirubin: 0.5 mg/dL (ref 0.3–1.2)

## 2010-11-26 LAB — RPR: RPR Ser Ql: NONREACTIVE

## 2010-11-26 LAB — MAGNESIUM: Magnesium: 1.6 mg/dL (ref 1.5–2.5)

## 2010-11-26 LAB — TROPONIN I: Troponin I: 0.03 ng/mL (ref 0.00–0.06)

## 2010-12-04 LAB — DIFFERENTIAL
Basophils Relative: 1 % (ref 0–1)
Eosinophils Absolute: 0.2 10*3/uL (ref 0.0–0.7)
Lymphs Abs: 2.3 10*3/uL (ref 0.7–4.0)
Monocytes Absolute: 0.8 10*3/uL (ref 0.1–1.0)
Monocytes Relative: 7 % (ref 3–12)
Neutrophils Relative %: 74 % (ref 43–77)

## 2010-12-04 LAB — CBC
Hemoglobin: 10.3 g/dL — ABNORMAL LOW (ref 12.0–15.0)
MCHC: 35 g/dL (ref 30.0–36.0)
Platelets: 167 10*3/uL (ref 150–400)
RDW: 13.2 % (ref 11.5–15.5)

## 2010-12-04 LAB — URINE MICROSCOPIC-ADD ON

## 2010-12-04 LAB — COMPREHENSIVE METABOLIC PANEL
ALT: 13 U/L (ref 0–35)
Albumin: 2.8 g/dL — ABNORMAL LOW (ref 3.5–5.2)
Alkaline Phosphatase: 62 U/L (ref 39–117)
Calcium: 8.3 mg/dL — ABNORMAL LOW (ref 8.4–10.5)
GFR calc Af Amer: >60 mL/min (ref >60)
Potassium: 3.1 mEq/L — ABNORMAL LOW (ref 3.5–5.1)
Sodium: 135 mEq/L (ref 135–145)
Total Protein: 5.5 g/dL — ABNORMAL LOW (ref 6.0–8.3)

## 2010-12-04 LAB — URINALYSIS, ROUTINE W REFLEX MICROSCOPIC
Glucose, UA: NEGATIVE mg/dL
Hgb urine dipstick: NEGATIVE
Ketones, ur: NEGATIVE mg/dL
Protein, ur: NEGATIVE mg/dL
pH: 7.5 (ref 5.0–8.0)

## 2010-12-29 ENCOUNTER — Other Ambulatory Visit: Payer: Self-pay | Admitting: *Deleted

## 2010-12-29 MED ORDER — CARVEDILOL 6.25 MG PO TABS: 6.2500 mg | ORAL_TABLET | Freq: Two times a day (BID) | ORAL | Status: DC

## 2011-03-09 IMAGING — RF DG HYSTEROGRAM
5 series · 5 of 5 positions shown · IV contrast (omnipaque)
Comparison: none

CLINICAL DATA: Status post Essure

HYSTEROSALPINGOGRAM
TECHNIQUE: Following cleansing of the cervix and vagina with
Betadine solution, a hysterosalpingogram was performed using a 5-
French hysterosalpingogram catheter and Omnipaque 300 contrast.
The patient tolerated the examination without difficulty.

[Series 1: run · 1 of 1 slices shown (1 of 5)]
[im 1/1]
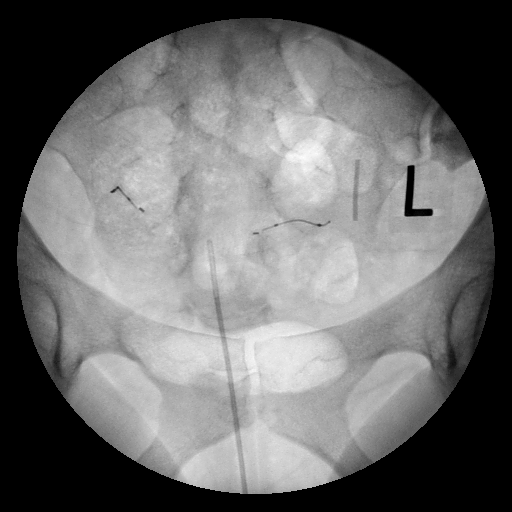

[Series 2: run · 1 of 1 slices shown (2 of 5)]
[im 1/1]
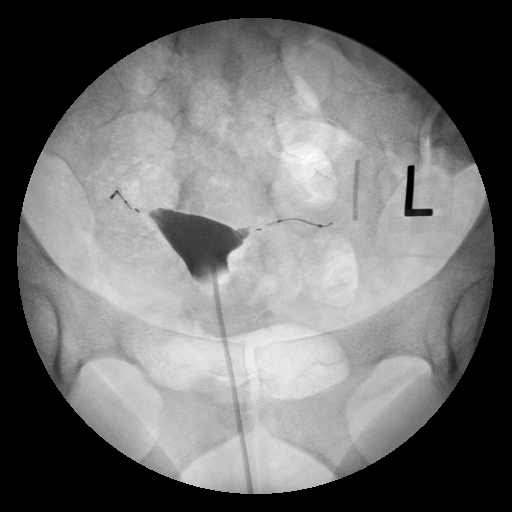

[Series 3: run · 1 of 1 slices shown (3 of 5)]
[im 1/1]
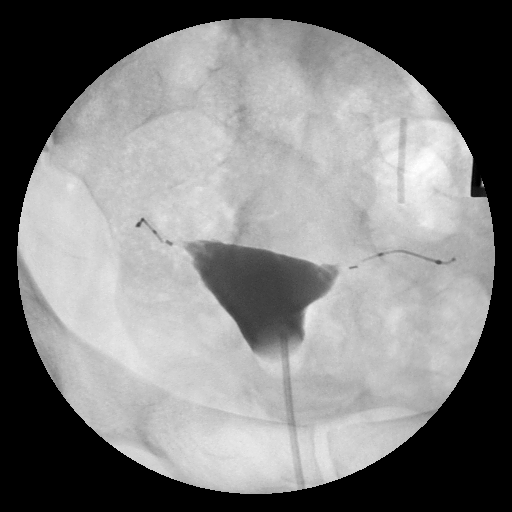

[Series 4: run · 1 of 1 slices shown (4 of 5)]
[im 1/1]
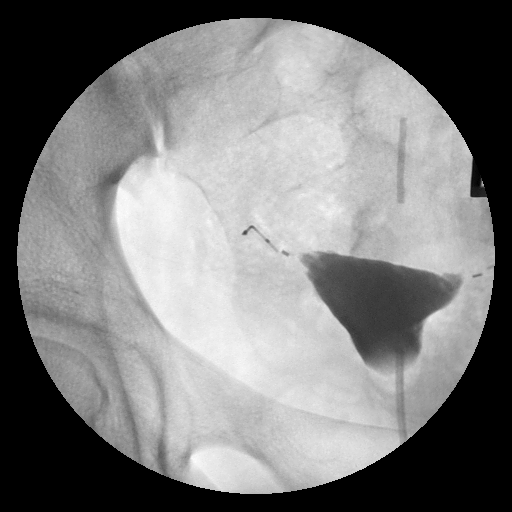

[Series 5: run · 1 of 1 slices shown (5 of 5)]
[im 1/1]
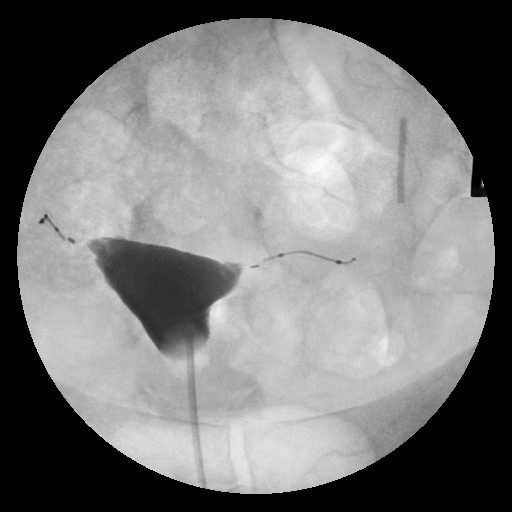

[5 of 5 positions shown; findings below may reference images not displayed]

FINDINGS: Both Essure coils are in the proper position at the
proximal interstitial portions of both tubes.  There is no contrast
in either tube.  No intrauterine fixed filling defects are noted.
IMPRESSION: Non patent tubes bilaterally and proper Essure coil position.

## 2011-05-25 LAB — I-STAT 8, (EC8 V) (CONVERTED LAB)
Acid-base deficit: 2
BUN: 8
Bicarbonate: 22.1
Chloride: 107
Glucose, Bld: 93
HCT: 40
Hemoglobin: 13.6
Operator id: 282201
Potassium: 3.6
Sodium: 138
TCO2: 23
pCO2, Ven: 34.2 — ABNORMAL LOW
pH, Ven: 7.419 — ABNORMAL HIGH

## 2011-05-25 LAB — POCT I-STAT CREATININE
Creatinine, Ser: 0.7
Operator id: 282201

## 2011-06-06 LAB — CBC
MCHC: 33.3
MCV: 96.1
Platelets: 185
WBC: 10

## 2011-06-06 LAB — DIFFERENTIAL
Basophils Absolute: 0.1
Basophils Relative: 1
Eosinophils Absolute: 0.1
Lymphs Abs: 2.9
Neutrophils Relative %: 62

## 2011-06-06 LAB — WET PREP, GENITAL
Trich, Wet Prep: NONE SEEN
Yeast Wet Prep HPF POC: NONE SEEN

## 2011-10-26 ENCOUNTER — Telehealth: Payer: Self-pay | Admitting: Cardiology

## 2011-10-26 NOTE — Telephone Encounter (Signed)
Pt calling to set up appt with wall and echo, no order, does she need this?

## 2011-10-26 NOTE — Telephone Encounter (Signed)
I spoke with pt about scheduling ECHO and appt.  She stated Dr. Daleen Squibb had wanted a repeat Echo for her HF last June but due to financial burden was unable to schedule. Echo ordered. She states she has been having some swelling of her feet and feeling more tired recently. Medication list updated. Appt with Scott PA on 10/31/11 same day as Dr. Daleen Squibb in office. Pt agrees with this plan

## 2011-10-31 ENCOUNTER — Telehealth: Payer: Self-pay | Admitting: Cardiology

## 2011-10-31 ENCOUNTER — Encounter: Payer: Self-pay | Admitting: Physician Assistant

## 2011-10-31 ENCOUNTER — Ambulatory Visit: Payer: BC Managed Care – PPO | Admitting: Physician Assistant

## 2011-10-31 ENCOUNTER — Ambulatory Visit (INDEPENDENT_AMBULATORY_CARE_PROVIDER_SITE_OTHER): Payer: BC Managed Care – PPO | Admitting: Physician Assistant

## 2011-10-31 VITALS — BP 118/72 | HR 71 | Ht 69.0 in | Wt 180.0 lb

## 2011-10-31 DIAGNOSIS — I10 Essential (primary) hypertension: Secondary | ICD-10-CM

## 2011-10-31 DIAGNOSIS — R079 Chest pain, unspecified: Secondary | ICD-10-CM

## 2011-10-31 DIAGNOSIS — I429 Cardiomyopathy, unspecified: Secondary | ICD-10-CM

## 2011-10-31 DIAGNOSIS — R5383 Other fatigue: Secondary | ICD-10-CM | POA: Insufficient documentation

## 2011-10-31 DIAGNOSIS — I5022 Chronic systolic (congestive) heart failure: Secondary | ICD-10-CM

## 2011-10-31 DIAGNOSIS — R0609 Other forms of dyspnea: Secondary | ICD-10-CM

## 2011-10-31 DIAGNOSIS — R002 Palpitations: Secondary | ICD-10-CM

## 2011-10-31 LAB — CBC WITH DIFFERENTIAL/PLATELET
Basophils Absolute: 0 10*3/uL (ref 0.0–0.1)
Basophils Relative: 0.4 % (ref 0.0–3.0)
Eosinophils Absolute: 0.1 10*3/uL (ref 0.0–0.7)
Lymphocytes Relative: 37.1 % (ref 12.0–46.0)
MCHC: 33.5 g/dL (ref 30.0–36.0)
MCV: 92.6 fl (ref 78.0–100.0)
Monocytes Absolute: 0.6 10*3/uL (ref 0.1–1.0)
Neutrophils Relative %: 54.7 % (ref 43.0–77.0)
Platelets: 229 10*3/uL (ref 150.0–400.0)
RDW: 13.9 % (ref 11.5–14.6)

## 2011-10-31 LAB — BASIC METABOLIC PANEL
BUN: 13 mg/dL (ref 6–23)
CO2: 28 mEq/L (ref 19–32)
Calcium: 9.2 mg/dL (ref 8.4–10.5)
Chloride: 104 mEq/L (ref 96–112)
Creatinine, Ser: 0.7 mg/dL (ref 0.4–1.2)
Glucose, Bld: 94 mg/dL (ref 70–99)

## 2011-10-31 LAB — BRAIN NATRIURETIC PEPTIDE: Pro B Natriuretic peptide (BNP): 6 pg/mL (ref 0.0–100.0)

## 2011-10-31 LAB — TSH: TSH: 1.34 u[IU]/mL (ref 0.35–5.50)

## 2011-10-31 NOTE — Assessment & Plan Note (Signed)
Likely multifactorial.  She does describe symptoms of snoring.  She likely has a component of sleep apnea.  I have recommended weight loss.  If this does not improve her symptoms, consider referral to sleep medicine.  Check a basic metabolic panel, TSH, CBC and BNP today.  Arrange followup echocardiogram to reassess her LV function.  Followup with Dr. Daleen Squibb in one month.

## 2011-10-31 NOTE — Assessment & Plan Note (Signed)
Volume appears stable.  I've asked her to check her scale at home to see if this is accurate.  She can continue taking Lasix p.r.n. For now.  I will adjust her Lasix if her BNP is significantly elevated.

## 2011-10-31 NOTE — Assessment & Plan Note (Signed)
Repeat echo

## 2011-10-31 NOTE — Assessment & Plan Note (Signed)
Atypical.  I will arrange a plain treadmill test to both rule out ischemia and to assess her functional status.

## 2011-10-31 NOTE — Assessment & Plan Note (Signed)
Controlled.  Continue current therapy.  

## 2011-10-31 NOTE — Telephone Encounter (Signed)
Form signed today by Dr. Daleen Squibb and faxed this pm Mylo Red RN

## 2011-10-31 NOTE — Telephone Encounter (Signed)
[name] with dr biggerstaff's office calling to get status of surgical clearence, pls call

## 2011-10-31 NOTE — Patient Instructions (Signed)
Your physician recommends that you schedule a follow-up appointment in: 1 month with Dr Daleen Squibb Your physician recommends that you have lab work drawn today (BMP, CBC, TSH, BNP) Your physician has requested that you have an exercise tolerance test. For further information please visit [email or web address] Please also follow instruction sheet, as given.

## 2011-10-31 NOTE — Progress Notes (Signed)
[address] [place], [place]  [address] Phone: [phone] Fax:  [phone]  Date:  10/31/2011   Name:  Shelby Smith       DOB:  [birth year] MRN:  [id number]  PCP:  Dr. Garner Nash at [facility] Primary Cardiologist:  Dr. Valera Castle  Primary Electrophysiologist:  None    History of Present Illness: Shelby Smith is a 26 y.o. female who presents for follow up.  She has a history of chronic systolic heart failure in the setting of post partum cardiomyopathy.  She was admitted for congestive heart failure in 3/11 2 weeks after delivering her child.  Ejection fraction was as low as 20-25%.  She was last seen by Dr. Daleen Squibb 12/11.  Echocardiogram 12/11: EF 45-50%.  She has had problems with fatigue.  This is probably been going on for the last one year.  It seems to just be mildly worse.  She sleeps on 2 pillows.  This is chronic.  She denies PND.  She has occasional ankle edema.  She takes Lasix with prompt relief.  She has chest discomfort.  This is substernal and described as pressure.  She's had it for at least 6 months or more.  She gets it at rest and with exertion.  She denies syncope.  She describes class 2-2b DOE.  She does admit to snoring.  She does admit to daytime hypersomnolence.  Her weight has gone up over the last several months.  She weighed 160 pounds when she was last seen in this office.  She does no fluctuations in her weights from day to day on her scale at home.  She admits to compliance with her medications.  Past Medical History  Diagnosis Date  . Cardiomyopathy, peripartum     12/11.  Echocardiogram 12/11: EF 45-50%  . Chronic systolic heart failure   . HTN (hypertension)     Current Outpatient Prescriptions  Medication Sig Dispense Refill  . carvedilol (COREG) 6.25 MG tablet Take 1 tablet (6.25 mg total) by mouth 2 (two) times daily.  60 tablet  11  . furosemide (LASIX) 20 MG tablet Take 20 mg by mouth daily as needed.      [name]  lisinopril (PRINIVIL,ZESTRIL) 10 MG tablet Take 1 tablet (10 mg total) by mouth daily.  30 tablet  11  . oxyCODONE-acetaminophen (PERCOCET) 5-325 MG per tablet Take 1 tablet by mouth daily.      [name] PARoxetine (PAXIL) 40 MG tablet Take 1 tablet by mouth as needed.      [name] spironolactone (ALDACTONE) 25 MG tablet Take 25 mg by mouth daily.        Allergies: No Known Allergies  History  Substance Use Topics  . Smoking status: Former Smoker    Quit date: 10/30/2008  . Smokeless tobacco: Not on file  . Alcohol Use: No     Family History  Problem Relation Age of Onset  . Heart attack Paternal Grandfather      ROS:  Please see the history of present illness.    All other systems reviewed and negative.   PHYSICAL EXAM: VS:  BP 118/72  Pulse 71  Ht 5\' 9"  (1.753 m)  Wt 180 lb (81.647 kg)  BMI 26.58 kg/m2 Well nourished, well developed, in no acute distress HEENT: normal Neck: no JVD Endocrine: No thyromegaly Vascular: No carotid bruits Cardiac:  normal S1, S2; RRR; no murmur, No gallop Lungs:  clear to auscultation bilaterally, no wheezing, rhonchi or rales Abd:  soft, nontender, no hepatomegaly Ext: no edema Skin: warm and dry Neuro:  CNs 2-12 intact, no focal abnormalities noted  EKG:  Sinus rhythm, heart rate 71, normal axis  ASSESSMENT AND PLAN:

## 2011-11-06 ENCOUNTER — Other Ambulatory Visit (HOSPITAL_COMMUNITY): Payer: Self-pay | Admitting: Cardiology

## 2011-11-06 DIAGNOSIS — O903 Peripartum cardiomyopathy: Secondary | ICD-10-CM

## 2011-11-07 ENCOUNTER — Other Ambulatory Visit: Payer: Self-pay

## 2011-11-07 ENCOUNTER — Telehealth: Payer: Self-pay | Admitting: Cardiology

## 2011-11-07 ENCOUNTER — Ambulatory Visit (HOSPITAL_COMMUNITY): Payer: BC Managed Care – PPO | Attending: Cardiology

## 2011-11-07 DIAGNOSIS — R0989 Other specified symptoms and signs involving the circulatory and respiratory systems: Secondary | ICD-10-CM | POA: Insufficient documentation

## 2011-11-07 DIAGNOSIS — O903 Peripartum cardiomyopathy: Secondary | ICD-10-CM

## 2011-11-07 DIAGNOSIS — R0609 Other forms of dyspnea: Secondary | ICD-10-CM | POA: Insufficient documentation

## 2011-11-07 DIAGNOSIS — R002 Palpitations: Secondary | ICD-10-CM | POA: Insufficient documentation

## 2011-11-07 DIAGNOSIS — I10 Essential (primary) hypertension: Secondary | ICD-10-CM | POA: Insufficient documentation

## 2011-11-07 DIAGNOSIS — R079 Chest pain, unspecified: Secondary | ICD-10-CM | POA: Insufficient documentation

## 2011-11-07 NOTE — Telephone Encounter (Signed)
Pt aware of improved EF in her recent  ECHO results as reviewed by Dr. Daleen Squibb. Mylo Red RN

## 2011-11-07 NOTE — Telephone Encounter (Signed)
Fu call °Patient returning your call °

## 2011-11-21 ENCOUNTER — Encounter: Payer: Self-pay | Admitting: *Deleted

## 2011-12-05 ENCOUNTER — Ambulatory Visit (INDEPENDENT_AMBULATORY_CARE_PROVIDER_SITE_OTHER): Payer: BC Managed Care – PPO | Admitting: Cardiology

## 2011-12-05 DIAGNOSIS — I5022 Chronic systolic (congestive) heart failure: Secondary | ICD-10-CM

## 2011-12-05 DIAGNOSIS — R002 Palpitations: Secondary | ICD-10-CM

## 2011-12-05 MED ORDER — SPIRONOLACTONE 25 MG PO TABS: 25.0000 mg | ORAL_TABLET | Freq: Every day | ORAL | Status: DC

## 2011-12-05 MED ORDER — CARVEDILOL 6.25 MG PO TABS: 6.2500 mg | ORAL_TABLET | Freq: Two times a day (BID) | ORAL | Status: DC

## 2011-12-05 MED ORDER — LISINOPRIL 10 MG PO TABS: 10.0000 mg | ORAL_TABLET | Freq: Every day | ORAL | Status: DC

## 2011-12-05 MED ORDER — FUROSEMIDE 20 MG PO TABS: 20.0000 mg | ORAL_TABLET | Freq: Every day | ORAL | Status: DC | PRN

## 2011-12-05 NOTE — Patient Instructions (Signed)
Your physician wants you to follow-up in: 1 year with Dr. Dorinda Hill will receive a reminder letter in the mail two months in advance. If you don't  receive a letter, please call our office to schedule the follow-up appointment.  Your physician has requested that you have an echocardiogram. Echocardiography is a painless test that uses sound waves to create images of your heart. It provides your doctor with information about the size and shape of your heart and how well your heart's chambers and valves are working. This procedure takes approximately one hour. There are no restrictions for this procedure. ECHO in 1 year prior to seeing Dr. Daleen Squibb   Your physician recommends that you continue on your current medications as directed. Please refer to the Current Medication list given to you today.

## 2011-12-05 NOTE — Procedures (Signed)
Exercise Treadmill Test  Pre-Exercise Testing Evaluation Rhythm: normal sinus  Rate: 84   PR:  .15 QRS:  .07  QT:  .45 QTc: 53     Test  Exercise Tolerance Test Ordering MD: Valera Castle, MD  Interpreting MD:  Valera Castle, MD  Unique Test No: 1  Treadmill:  1  Indication for ETT: Palpitations  Contraindication to ETT: No   Stress Modality: exercise - treadmill  Cardiac Imaging Performed: non   Protocol: standard Bruce - maximal  Max BP:  134/52  Max MPHR (bpm):  194 85% MPR (bpm):  165  MPHR obtained (bpm):  148 % MPHR obtained:  75%  Reached 85% MPHR (min:sec):N/A Total Exercise Time (min-sec):  6:56  Workload in METS:  8.4 Borg Scale: 15  Reason ETT Terminated:  fatigue    ST Segment Analysis At Rest: normal ST segments - no evidence of significant ST depression With Exercise: non-specific ST changes  Other Information Arrhythmia:  No Angina during ETT:  absent (0) Quality of ETT:  non-diagnostic  ETT Interpretation:  normal - no evidence of ischemia by ST analysis  Comments: No evidence of any arrhythmias. Reduced exercise tolerance secondary to deconditioning.  Recommendations: Safe to exercise. Exercise parameters given to the patient.

## 2012-02-05 ENCOUNTER — Telehealth: Payer: Self-pay | Admitting: Cardiology

## 2012-02-05 NOTE — Telephone Encounter (Signed)
PT LETTER STATING RESULTS OF STRESS TEST , MEDICAL CONDITION, AND MEDICAL RECORDS, PLS CALL WHEN  READY [phone]

## 2012-02-05 NOTE — Telephone Encounter (Signed)
Spoke with pt who reports she has been trying to work but due to feeling exhausted she has not been able to work.   She is asking for a letter from Dr. Daleen Squibb indicating she is not able to work at this time.  She does not have any paperwork at this time that needs to be completed.  She is going to social security office today to find out what she needs to do to file for disability.  I asked her to contact our office after social security meeting to let us know  what type of letter she needed and what paperwork Dr. Daleen Squibb would need to complete.

## 2012-02-05 NOTE — Telephone Encounter (Signed)
Please return call to patient at [phone] regarding disability papers.

## 2012-02-12 NOTE — Telephone Encounter (Signed)
Pt needs letter from Dr. Daleen Squibb stating her current cardiovascular medical condition including results of her exercise treadmill and echo. Forwarded to Dr. Daleen Squibb.  Mylo Red RN

## 2012-02-18 NOTE — Telephone Encounter (Signed)
Dr Daleen Squibb is here on Wed pt aware we will call when letter is complete

## 2012-02-18 NOTE — Telephone Encounter (Signed)
Fu call Pt called about status of letter for disability. Please call her back

## 2012-02-20 NOTE — Telephone Encounter (Signed)
Dr. Daleen Squibb spoke with Ms. Whitcomb on the phone and advised pt that from a cardiac standpoint, she is capable of working and that in fact, her EF has improved from previous levels based on her recent ECHO.

## 2012-11-10 ENCOUNTER — Telehealth: Payer: Self-pay | Admitting: Cardiology

## 2012-11-10 NOTE — Telephone Encounter (Signed)
Pt having Echo on Thursday 3/13.  Wants to see Dr. Daleen Squibb soon.  Will forward to Mylo Red to schedule pt.

## 2012-11-10 NOTE — Telephone Encounter (Signed)
New Problem:    Patient called in wanting to be scheduled to have an ECHO and see Dr. Daleen Squibb sooner because she is having issues now.  Please call back.

## 2012-11-12 NOTE — Telephone Encounter (Signed)
I spoke with pt today about follow-up appt. With Dr. Daleen Squibb after her ECHO.   She states that her blood pressure has been running low for over a week and she has not taken her Carvedilol or her Lisinopril in over a week. States blood pressure is 95/58. Experiencing increasing shortness of breath with exertion as well as "sharp pain that keeps moving under my rib cage" also has had some "hurting in my back"  Notes an increase in weight gain of 5 pounds within one day yesterday. She is taking her diuretics.  Wt was 173 yesterday am then up to 178.5 that evening Today's weight is 179.  She is scheduled for an ECHO tomorrow after noon at 4pm. Would like to be seen by md as these symptoms have increased. Will offer appt with DOD tomorrow & see if ECHO can be at an earlier time. Mylo Red RN

## 2012-11-12 NOTE — Telephone Encounter (Signed)
LMTCB Debbie Gray RN  

## 2012-11-13 ENCOUNTER — Encounter: Payer: Self-pay | Admitting: Cardiovascular Disease

## 2012-11-13 ENCOUNTER — Ambulatory Visit (HOSPITAL_COMMUNITY): Payer: Medicaid Other | Attending: Cardiology

## 2012-11-13 ENCOUNTER — Ambulatory Visit (INDEPENDENT_AMBULATORY_CARE_PROVIDER_SITE_OTHER): Payer: Medicaid Other | Admitting: Cardiovascular Disease

## 2012-11-13 VITALS — BP 121/76 | HR 64 | Ht 69.0 in | Wt 179.0 lb

## 2012-11-13 DIAGNOSIS — R002 Palpitations: Secondary | ICD-10-CM

## 2012-11-13 DIAGNOSIS — I079 Rheumatic tricuspid valve disease, unspecified: Secondary | ICD-10-CM | POA: Insufficient documentation

## 2012-11-13 DIAGNOSIS — R635 Abnormal weight gain: Secondary | ICD-10-CM

## 2012-11-13 DIAGNOSIS — R0609 Other forms of dyspnea: Secondary | ICD-10-CM | POA: Insufficient documentation

## 2012-11-13 DIAGNOSIS — R079 Chest pain, unspecified: Secondary | ICD-10-CM

## 2012-11-13 DIAGNOSIS — I5022 Chronic systolic (congestive) heart failure: Secondary | ICD-10-CM

## 2012-11-13 DIAGNOSIS — R072 Precordial pain: Secondary | ICD-10-CM

## 2012-11-13 DIAGNOSIS — I959 Hypotension, unspecified: Secondary | ICD-10-CM

## 2012-11-13 DIAGNOSIS — R0989 Other specified symptoms and signs involving the circulatory and respiratory systems: Secondary | ICD-10-CM | POA: Insufficient documentation

## 2012-11-13 DIAGNOSIS — I509 Heart failure, unspecified: Secondary | ICD-10-CM | POA: Insufficient documentation

## 2012-11-13 DIAGNOSIS — I10 Essential (primary) hypertension: Secondary | ICD-10-CM | POA: Insufficient documentation

## 2012-11-13 LAB — CBC WITH DIFFERENTIAL/PLATELET
Basophils Absolute: 0 10*3/uL (ref 0.0–0.1)
Eosinophils Absolute: 0.1 10*3/uL (ref 0.0–0.7)
Hemoglobin: 12.5 g/dL (ref 12.0–15.0)
Lymphocytes Relative: 34 % (ref 12.0–46.0)
Lymphs Abs: 2.6 10*3/uL (ref 0.7–4.0)
MCHC: 33.8 g/dL (ref 30.0–36.0)
Neutro Abs: 4.4 10*3/uL (ref 1.4–7.7)
Platelets: 232 10*3/uL (ref 150.0–400.0)
RDW: 13.8 % (ref 11.5–14.6)

## 2012-11-13 LAB — BASIC METABOLIC PANEL
BUN: 14 mg/dL (ref 6–23)
CO2: 26 mEq/L (ref 19–32)
Calcium: 9.3 mg/dL (ref 8.4–10.5)
Glucose, Bld: 99 mg/dL (ref 70–99)
Sodium: 139 mEq/L (ref 135–145)

## 2012-11-13 NOTE — Progress Notes (Signed)
Echocardiogram performed.  

## 2012-11-13 NOTE — Assessment & Plan Note (Signed)
No evidence of ongong CHF Check BNP lungs clear on exam Can likely simplify diuretic scheme

## 2012-11-13 NOTE — Assessment & Plan Note (Signed)
Noncardiac Echo today and check BNP  Lungs clear on exam

## 2012-11-13 NOTE — Patient Instructions (Signed)
Your physician recommends that you schedule a follow-up appointment in: WITH DR WALL FIRST AVAILABLE Your physician recommends that you continue on your current medications as directed. Please refer to the Current Medication list given to you today.   Your physician recommends that you return for lab work in: TODAY  BMET CBC ESR TSH  BNP

## 2012-11-13 NOTE — Progress Notes (Signed)
Patient ID: Shelby Smith, female   DOB: Feb 06, 1986, 27 y.o.   MRN: [id number] Shelby Smith is a 27 y.o. female patient of Dr Daleen Squibb added on to DOD schedule   She has a history of acute  systolic heart failure in the setting of post partum cardiomyopathy. She was admitted for congestive heart failure in 3/11 2 weeks after delivering her child. Ejection fraction was as low as 20-25%. She was last seen by Dr. Daleen Squibb 12/11. Echocardiogram 12/11: EF 45-50%.  Last echo 3/13 showed EF 50-55%  Has had atypical chest pain and dyspnea last couple of months.  Pain can be constant Not pleuritic Started a new job and feels more dyspnic going up stairs. No LE edema  Still on 2 diuretics despite improvement in EF.  Has echo scheduled for latter today. No fever, sputum  No cough  ROS: Denies fever, malais, weight loss, blurry vision, decreased visual acuity, cough, sputum, SOB, hemoptysis, pleuritic pain, palpitaitons, heartburn, abdominal pain, melena, lower extremity edema, claudication, or rash.  All other systems reviewed and negative  General: Affect appropriate Healthy:  appears stated age HEENT: normal Neck supple with no adenopathy JVP normal no bruits no thyromegaly Lungs clear with no wheezing and good diaphragmatic motion Heart:  S1/S2 no murmur, no rub, gallop or click PMI normal Abdomen: benighn, BS positve, no tenderness, no AAA no bruit.  No HSM or HJR Distal pulses intact with no bruits No edema Neuro non-focal Skin warm and dry No muscular weakness   Current Outpatient Prescriptions  Medication Sig Dispense Refill  . carvedilol (COREG) 6.25 MG tablet Take 1 tablet (6.25 mg total) by mouth 2 (two) times daily.  60 tablet  11  . furosemide (LASIX) 20 MG tablet Take 1 tablet (20 mg total) by mouth daily as needed.  30 tablet  11  . lisinopril (PRINIVIL,ZESTRIL) 10 MG tablet Take 1 tablet (10 mg total) by mouth daily.  30 tablet  11  . spironolactone (ALDACTONE) 25 MG tablet Take 1 tablet  (25 mg total) by mouth daily.  30 tablet  11   No current facility-administered medications for this visit.    Allergies  Review of patient's allergies indicates no known allergies.  Electrocardiogram:  NSR rate 64 normal ECG   Assessment and Plan

## 2012-11-13 NOTE — Assessment & Plan Note (Signed)
Atypical no rub ECG normal F/U echo and ESR  Suggested motrin 200 bid for starters.  F/U Dr Daleen Squibb

## 2012-12-23 ENCOUNTER — Ambulatory Visit (INDEPENDENT_AMBULATORY_CARE_PROVIDER_SITE_OTHER): Payer: Medicaid Other | Admitting: Cardiology

## 2012-12-23 ENCOUNTER — Encounter: Payer: Self-pay | Admitting: Cardiology

## 2012-12-23 VITALS — BP 106/62 | HR 79 | Ht 69.0 in | Wt 172.0 lb

## 2012-12-23 DIAGNOSIS — I5022 Chronic systolic (congestive) heart failure: Secondary | ICD-10-CM

## 2012-12-23 DIAGNOSIS — I429 Cardiomyopathy, unspecified: Secondary | ICD-10-CM

## 2012-12-23 MED ORDER — FUROSEMIDE 20 MG PO TABS: 20.0000 mg | ORAL_TABLET | Freq: Every day | ORAL | Status: DC

## 2012-12-23 NOTE — Assessment & Plan Note (Signed)
Last echocardiogram demonstrated normal left ventricular function. I'll stop her spironolactone. Continue other medications. Return the office in one year with Dr. Delton See.

## 2012-12-23 NOTE — Progress Notes (Signed)
HPI Shelby Smith comes in today for her history of postpartum cardiomyopathy. Recent echocardiogram showed complete recovery as did last year.  She does retain fluid she does not take her Lasix. She denies any orthopnea, PND or dyspnea on exertion. She denies any palpitations, presyncope or syncope, or chest pain. She is compliant with her medications.  Past Medical History  Diagnosis Date  . Cardiomyopathy, peripartum     12/11.  Echocardiogram 12/11: EF 45-50%  . HTN (hypertension)   . Chest pain, unspecified   . Palpitations   . Chronic systolic heart failure   . Hidradenitis     vulvar    Current Outpatient Prescriptions  Medication Sig Dispense Refill  . carvedilol (COREG) 6.25 MG tablet Take 1 tablet (6.25 mg total) by mouth 2 (two) times daily.  60 tablet  11  . lisinopril (PRINIVIL,ZESTRIL) 10 MG tablet Take 1 tablet (10 mg total) by mouth daily.  30 tablet  11  . furosemide (LASIX) 20 MG tablet Take 1 tablet (20 mg total) by mouth daily.  90 tablet  3   No current facility-administered medications for this visit.    No Known Allergies  Family History  Problem Relation Age of Onset  . Heart attack Paternal Grandfather   . Coronary artery disease      family history  . Cancer      family history    History   Social History  . Marital Status: Single    Spouse Name: N/A    Number of Children: N/A  . Years of Education: N/A   Occupational History  . Not on file.   Social History Main Topics  . Smoking status: Former Smoker    Quit date: 10/30/2008  . Smokeless tobacco: Not on file  . Alcohol Use: Yes     Comment: rarely  . Drug Use: No  . Sexually Active: Not on file   Other Topics Concern  . Not on file   Social History Narrative  . No narrative on file    ROS ALL NEGATIVE EXCEPT THOSE NOTED IN HPI  PE  General Appearance: well developed, well nourished in no acute distress HEENT: symmetrical face, PERRLA, good dentition  Neck: no JVD, thyromegaly,  or adenopathy, trachea midline Chest: symmetric without deformity Cardiac: PMI non-displaced, RRR, normal S1, S2, no gallop or murmur Lung: clear to ausculation and percussion Vascular: all pulses full without bruits  Abdominal: nondistended, nontender, good bowel sounds, no HSM, no bruits Extremities: no cyanosis, clubbing or edema, no sign of DVT, no varicosities  Skin: normal color, no rashes Neuro: alert and oriented x 3, non-focal Pysch: normal affect  EKG Not repeated today.  BMET    Component Value Date/Time   NA 139 11/13/2012 1120   K 3.8 11/13/2012 1120   CL 104 11/13/2012 1120   CO2 26 11/13/2012 1120   GLUCOSE 99 11/13/2012 1120   BUN 14 11/13/2012 1120   CREATININE 0.7 11/13/2012 1120   CALCIUM 9.3 11/13/2012 1120   GFRNONAA >60 11/21/2009 0320   GFRAA  Value: >60        The eGFR has been calculated using the MDRD equation. This calculation has not been validated in all clinical situations. eGFR's persistently <60 mL/min signify possible Chronic Kidney Disease. 11/21/2009 0320    Lipid Panel     Component Value Date/Time   CHOL  Value: 256        ATP III CLASSIFICATION:  <200     mg/dL  Desirable  200-239  mg/dL   Borderline High  >=960    mg/dL   High       * 4/54/0981 0619   TRIG 112 11/19/2009 0619   HDL 46 11/19/2009 0619   CHOLHDL 5.6 11/19/2009 0619   VLDL 22 11/19/2009 0619   LDLCALC  Value: 188        Total Cholesterol/HDL:CHD Risk Coronary Heart Disease Risk Table                     Men   Women  1/2 Average Risk   3.4   3.3  Average Risk       5.0   4.4  2 X Average Risk   9.6   7.1  3 X Average Risk  23.4   11.0        Use the calculated Patient Ratio above and the CHD Risk Table to determine the patient's CHD Risk.        ATP III CLASSIFICATION (LDL):  <100     mg/dL   Optimal  191-478  mg/dL   Near or Above                    Optimal  130-159  mg/dL   Borderline  295-621  mg/dL   High  >308     mg/dL   Very High* 6/57/8469 0619    CBC    Component Value  Date/Time   WBC 7.6 11/13/2012 1120   RBC 4.17 11/13/2012 1120   HGB 12.5 11/13/2012 1120   HCT 36.9 11/13/2012 1120   PLT 232.0 11/13/2012 1120   MCV 88.6 11/13/2012 1120   MCHC 33.8 11/13/2012 1120   RDW 13.8 11/13/2012 1120   LYMPHSABS 2.6 11/13/2012 1120   MONOABS 0.5 11/13/2012 1120   EOSABS 0.1 11/13/2012 1120   BASOSABS 0.0 11/13/2012 1120

## 2012-12-23 NOTE — Patient Instructions (Addendum)
Stop taking Spironolactone.   Your physician wants you to follow-up in: 1 year with Dr. Delton See at [facility]. You will receive a reminder letter in the mail two months in advance. If you don't receive a letter, please call our office to schedule the follow-up appointment.

## 2013-11-18 ENCOUNTER — Encounter: Payer: Self-pay | Admitting: Cardiology

## 2013-11-30 ENCOUNTER — Ambulatory Visit: Payer: Worker's Compensation | Attending: Orthopedic Surgery | Admitting: Rehabilitation

## 2013-11-30 DIAGNOSIS — M542 Cervicalgia: Secondary | ICD-10-CM | POA: Insufficient documentation

## 2013-11-30 DIAGNOSIS — IMO0001 Reserved for inherently not codable concepts without codable children: Secondary | ICD-10-CM | POA: Insufficient documentation

## 2013-11-30 DIAGNOSIS — M25519 Pain in unspecified shoulder: Secondary | ICD-10-CM | POA: Insufficient documentation

## 2013-12-02 ENCOUNTER — Ambulatory Visit: Payer: Worker's Compensation | Attending: Orthopedic Surgery | Admitting: Rehabilitation

## 2013-12-02 DIAGNOSIS — IMO0001 Reserved for inherently not codable concepts without codable children: Secondary | ICD-10-CM | POA: Insufficient documentation

## 2013-12-02 DIAGNOSIS — M25519 Pain in unspecified shoulder: Secondary | ICD-10-CM | POA: Insufficient documentation

## 2013-12-02 DIAGNOSIS — M542 Cervicalgia: Secondary | ICD-10-CM | POA: Insufficient documentation

## 2013-12-09 ENCOUNTER — Ambulatory Visit: Payer: Worker's Compensation | Admitting: Rehabilitation

## 2013-12-10 ENCOUNTER — Ambulatory Visit: Payer: Worker's Compensation | Admitting: Rehabilitation

## 2013-12-14 ENCOUNTER — Ambulatory Visit: Payer: Worker's Compensation | Admitting: Rehabilitation

## 2013-12-21 ENCOUNTER — Ambulatory Visit: Payer: Worker's Compensation | Attending: Orthopedic Surgery | Admitting: Rehabilitation

## 2013-12-21 DIAGNOSIS — M542 Cervicalgia: Secondary | ICD-10-CM | POA: Insufficient documentation

## 2013-12-21 DIAGNOSIS — M25519 Pain in unspecified shoulder: Secondary | ICD-10-CM | POA: Insufficient documentation

## 2013-12-21 DIAGNOSIS — IMO0001 Reserved for inherently not codable concepts without codable children: Secondary | ICD-10-CM | POA: Insufficient documentation

## 2013-12-23 ENCOUNTER — Ambulatory Visit: Payer: Worker's Compensation | Admitting: Rehabilitation

## 2013-12-28 ENCOUNTER — Ambulatory Visit: Payer: Worker's Compensation | Admitting: Rehabilitation

## 2013-12-30 ENCOUNTER — Ambulatory Visit: Payer: Worker's Compensation | Admitting: Rehabilitation

## 2014-01-05 ENCOUNTER — Ambulatory Visit: Payer: Worker's Compensation | Attending: Orthopedic Surgery | Admitting: Rehabilitation

## 2014-01-05 DIAGNOSIS — M542 Cervicalgia: Secondary | ICD-10-CM | POA: Insufficient documentation

## 2014-01-05 DIAGNOSIS — IMO0001 Reserved for inherently not codable concepts without codable children: Secondary | ICD-10-CM | POA: Insufficient documentation

## 2014-01-05 DIAGNOSIS — M25519 Pain in unspecified shoulder: Secondary | ICD-10-CM | POA: Insufficient documentation

## 2014-01-07 ENCOUNTER — Ambulatory Visit: Payer: Worker's Compensation | Admitting: Rehabilitation

## 2014-01-11 ENCOUNTER — Ambulatory Visit: Payer: Worker's Compensation | Admitting: Rehabilitation

## 2014-04-03 HISTORY — PX: OTHER SURGICAL HISTORY: SHX169

## 2014-04-12 ENCOUNTER — Telehealth: Payer: Self-pay

## 2014-04-12 DIAGNOSIS — I5022 Chronic systolic (congestive) heart failure: Secondary | ICD-10-CM

## 2014-04-12 MED ORDER — CARVEDILOL 6.25 MG PO TABS: 6.2500 mg | ORAL_TABLET | Freq: Two times a day (BID) | ORAL | Status: DC

## 2014-04-12 MED ORDER — FUROSEMIDE 20 MG PO TABS: 20.0000 mg | ORAL_TABLET | Freq: Every day | ORAL | Status: DC

## 2014-04-12 MED ORDER — LISINOPRIL 10 MG PO TABS: 10.0000 mg | ORAL_TABLET | Freq: Every day | ORAL | Status: DC

## 2014-04-12 NOTE — Telephone Encounter (Signed)
refill 

## 2014-04-13 ENCOUNTER — Other Ambulatory Visit: Payer: Self-pay | Admitting: *Deleted

## 2014-04-13 MED ORDER — FUROSEMIDE 20 MG PO TABS: 20.0000 mg | ORAL_TABLET | Freq: Every day | ORAL | Status: DC

## 2014-04-16 ENCOUNTER — Emergency Department (HOSPITAL_BASED_OUTPATIENT_CLINIC_OR_DEPARTMENT_OTHER)
Admission: EM | Admit: 2014-04-16 | Discharge: 2014-04-16 | Disposition: A | Discharge status: Home / Self Care | Payer: BC Managed Care – PPO | Attending: Emergency Medicine | Admitting: Emergency Medicine

## 2014-04-16 ENCOUNTER — Other Ambulatory Visit: Payer: Self-pay | Admitting: *Deleted

## 2014-04-16 ENCOUNTER — Encounter (HOSPITAL_BASED_OUTPATIENT_CLINIC_OR_DEPARTMENT_OTHER): Payer: Self-pay | Admitting: Emergency Medicine

## 2014-04-16 ENCOUNTER — Emergency Department (HOSPITAL_BASED_OUTPATIENT_CLINIC_OR_DEPARTMENT_OTHER): Payer: BC Managed Care – PPO

## 2014-04-16 DIAGNOSIS — R079 Chest pain, unspecified: Secondary | ICD-10-CM | POA: Insufficient documentation

## 2014-04-16 DIAGNOSIS — Z87891 Personal history of nicotine dependence: Secondary | ICD-10-CM | POA: Insufficient documentation

## 2014-04-16 DIAGNOSIS — Z79899 Other long term (current) drug therapy: Secondary | ICD-10-CM | POA: Insufficient documentation

## 2014-04-16 DIAGNOSIS — R0602 Shortness of breath: Secondary | ICD-10-CM | POA: Diagnosis present

## 2014-04-16 DIAGNOSIS — I5022 Chronic systolic (congestive) heart failure: Secondary | ICD-10-CM | POA: Diagnosis not present

## 2014-04-16 DIAGNOSIS — Z872 Personal history of diseases of the skin and subcutaneous tissue: Secondary | ICD-10-CM | POA: Insufficient documentation

## 2014-04-16 DIAGNOSIS — I10 Essential (primary) hypertension: Secondary | ICD-10-CM | POA: Insufficient documentation

## 2014-04-16 LAB — CBC
HCT: 36.4 % (ref 36.0–46.0)
HEMOGLOBIN: 12.5 g/dL (ref 12.0–15.0)
MCH: 30.4 pg (ref 26.0–34.0)
MCHC: 34.3 g/dL (ref 30.0–36.0)
MCV: 88.6 fL (ref 78.0–100.0)
Platelets: 223 10*3/uL (ref 150–400)
RBC: 4.11 MIL/uL (ref 3.87–5.11)
RDW: 12.5 % (ref 11.5–15.5)
WBC: 10.1 10*3/uL (ref 4.0–10.5)

## 2014-04-16 LAB — BASIC METABOLIC PANEL
Anion gap: 13 (ref 5–15)
BUN: 13 mg/dL (ref 6–23)
CALCIUM: 9.6 mg/dL (ref 8.4–10.5)
CHLORIDE: 103 meq/L (ref 96–112)
CO2: 24 meq/L (ref 19–32)
Creatinine, Ser: 0.6 mg/dL (ref 0.50–1.10)
GFR calc Af Amer: >90 mL/min (ref >90)
GFR calc non Af Amer: >90 mL/min (ref >90)
Glucose, Bld: 100 mg/dL — ABNORMAL HIGH (ref 70–99)
Potassium: 4.1 mEq/L (ref 3.7–5.3)
SODIUM: 140 meq/L (ref 137–147)

## 2014-04-16 LAB — TROPONIN I

## 2014-04-16 LAB — D-DIMER, QUANTITATIVE (NOT AT ARMC)

## 2014-04-16 LAB — PRO B NATRIURETIC PEPTIDE: PRO B NATRI PEPTIDE: 14.4 pg/mL (ref 0–125)

## 2014-04-16 MED ORDER — LISINOPRIL 10 MG PO TABS: 10.0000 mg | ORAL_TABLET | Freq: Every day | ORAL | Status: DC

## 2014-04-16 MED ORDER — ALBUTEROL SULFATE (2.5 MG/3ML) 0.083% IN NEBU
5.0000 mg | INHALATION_SOLUTION | Freq: Once | RESPIRATORY_TRACT | Status: AC
  Administered 2014-04-16: 5 mg via RESPIRATORY_TRACT
  Filled 2014-04-16: qty 6

## 2014-04-16 NOTE — ED Provider Notes (Signed)
CSN: [id number]     Arrival date & time 04/16/14  1410 History   First MD Initiated Contact with Patient 04/16/14 1412     Chief Complaint  Patient presents with  . Shortness of Breath     (Consider location/radiation/quality/duration/timing/severity/associated sxs/prior Treatment) HPI Comments: Patient is a 28 year old female with a past medical history of peripartum cardiomyopathy, hypertension and chronic systolic heart failure who presents to the emergency department complaining of sudden onset chest pain and shortness of breath beginning around 12:00 PM today, about 2 hours prior to arrival. Patient reports she was sitting at her desk when the pain began, located beneath her left breast radiating throughout the left side of her chest described as a pressure. No aggravating or alleviating factors. Shortness of breath worse on exertion. States this feels similar to when she had CHF 4 years ago. Denies leg swelling out of her normal. She is followed by cardiologist Dr. Eden Emms who she last saw in March. Denies any recent medication changes. Denies recent surgeries or travel. She is not on any exogenous estrogen. Denies history of blood clots.  Patient is a 28 y.o. female presenting with shortness of breath. The history is provided by the patient.  Shortness of Breath Associated symptoms: chest pain     Past Medical History  Diagnosis Date  . Cardiomyopathy, peripartum     12/11.  Echocardiogram 12/11: EF 45-50%  . HTN (hypertension)   . Chest pain, unspecified   . Palpitations   . Chronic systolic heart failure   . Hidradenitis     vulvar   Past Surgical History  Procedure Laterality Date  . Dilation and curettage, diagnostic / therapeutic    . Eardrum repair    . Right knee lateral release     Family History  Problem Relation Age of Onset  . Heart attack Paternal Grandfather   . Coronary artery disease      family history  . Cancer      family history   History  Substance  Use Topics  . Smoking status: Former Smoker    Quit date: 10/30/2008  . Smokeless tobacco: Not on file  . Alcohol Use: Yes     Comment: rarely   OB History   Grav Para Term Preterm Abortions TAB SAB Ect Mult Living                 Review of Systems  Respiratory: Positive for shortness of breath.   Cardiovascular: Positive for chest pain.  All other systems reviewed and are negative.     Allergies  Celestone  Home Medications   Prior to Admission medications   Medication Sig Start Date End Date Taking? Authorizing Provider  PARoxetine (PAXIL) 20 MG tablet Take 20 mg by mouth daily.   Yes Historical Provider, MD  carvedilol (COREG) 6.25 MG tablet Take 1 tablet (6.25 mg total) by mouth 2 (two) times daily. 04/12/14   Wendall Stade, MD  furosemide (LASIX) 20 MG tablet Take 1 tablet (20 mg total) by mouth daily. 04/13/14   Wendall Stade, MD  lisinopril (PRINIVIL,ZESTRIL) 10 MG tablet Take 1 tablet (10 mg total) by mouth daily. 04/16/14   Wendall Stade, MD   BP 108/70  Pulse 77  Temp(Src) 98.3 F (36.8 C) (Oral)  Resp 14  Ht 5\' 9"  (1.753 m)  Wt 190 lb (86.183 kg)  BMI 28.05 kg/m2  SpO2 100%  LMP 04/02/2014 Physical Exam  Nursing note and vitals reviewed. Constitutional: She  is oriented to person, place, and time. She appears well-developed and well-nourished. No distress.  HENT:  Head: Normocephalic and atraumatic.  Mouth/Throat: Oropharynx is clear and moist.  Eyes: Conjunctivae and EOM are normal. Pupils are equal, round, and reactive to light.  Neck: Normal range of motion. Neck supple. No JVD present.  Cardiovascular: Normal rate, regular rhythm, normal heart sounds and intact distal pulses.   No extremity edema.  Pulmonary/Chest: Effort normal and breath sounds normal. No respiratory distress. She exhibits no tenderness.  Abdominal: Soft. Bowel sounds are normal. There is no tenderness.  Musculoskeletal: Normal range of motion. She exhibits no edema.   Neurological: She is alert and oriented to person, place, and time. She has normal strength. No sensory deficit.  Speech fluent, goal oriented. Moves limbs without ataxia. Equal grip strength bilateral.  Skin: Skin is warm and dry. She is not diaphoretic.  Psychiatric: She has a normal mood and affect. Her behavior is normal.    ED Course  Procedures (including critical care time) Labs Review Labs Reviewed  BASIC METABOLIC PANEL - Abnormal; Notable for the following:    Glucose, Bld 100 (*)    All other components within normal limits  CBC  PRO B NATRIURETIC PEPTIDE  D-DIMER, QUANTITATIVE  TROPONIN I  Rosezena SensorI-STAT TROPOININ, ED    Imaging Review Dg Chest 2 View  04/16/2014   CLINICAL DATA:  SHORTNESS OF BREATH  EXAM: CHEST  2 VIEW  COMPARISON:  Chest CT 01/31/2010  FINDINGS: Normal mediastinum and cardiac silhouette. Normal pulmonary vasculature. No evidence of effusion, infiltrate, or pneumothorax. Subtle EKG leads noted over the upper lobes. No acute bony abnormality.  IMPRESSION: 1.  No acute cardiopulmonary process.   Electronically Signed   By: Genevive BiStewart  Edmunds M.D.   On: 04/16/2014 15:15     EKG Interpretation None       Date: 04/16/2014  Rate: 66  Rhythm: normal sinus rhythm  QRS Axis: normal  Intervals: normal  ST/T Wave abnormalities: normal  Conduction Disutrbances:none  Narrative Interpretation: NSR, cannot rule out anterior infarct, age undetermined  Old EKG Reviewed: unchanged   MDM   Final diagnoses:  Shortness of breath  Chest pain, unspecified chest pain type   Pt presenting with sudden onset CP and SOB. She is well appearing and in NAD. AFVSS. O2 sat 100% on RA. Hx of CHF followed by Dr. Eden EmmsNishan. Labs, CXR pending. EKG without any acute findings. 4:52 PM Labs about any acute findings. D-dimer within normal limits. Troponin negative. BNP within normal limits. Doubt cardiac or PE. Chest x-ray clear. Patient will be discharged, advised followup with her  cardiologist. Return precautions given. Patient states understanding of treatment care plan and is agreeable.  Trevor MaceRobyn M Albert, PA-C 04/16/14 1655

## 2014-04-16 NOTE — ED Notes (Signed)
Pt ambulatory to restroom with steady gait.

## 2014-04-16 NOTE — ED Notes (Signed)
Pt amb to triage with quick steady gait in nad. Pt reports sob x today, states this feels like her chf 4 years ago, also reports chest pressure that radiates to her left breast. ekg done at triage.

## 2014-04-16 NOTE — Discharge Instructions (Signed)
Chest Pain (Nonspecific) °It is often hard to give a specific diagnosis for the cause of chest pain. There is always a chance that your pain could be related to something serious, such as a heart attack or a blood clot in the lungs. You need to follow up with your health care provider for further evaluation. °CAUSES  °· Heartburn. °· Pneumonia or bronchitis. °· Anxiety or stress. °· Inflammation around your heart (pericarditis) or lung (pleuritis or pleurisy). °· A blood clot in the lung. °· A collapsed lung (pneumothorax). It can develop suddenly on its own (spontaneous pneumothorax) or from trauma to the chest. °· Shingles infection (herpes zoster virus). °The chest wall is composed of bones, muscles, and cartilage. Any of these can be the source of the pain. °· The bones can be bruised by injury. °· The muscles or cartilage can be strained by coughing or overwork. °· The cartilage can be affected by inflammation and become sore (costochondritis). °DIAGNOSIS  °Lab tests or other studies may be needed to find the cause of your pain. Your health care provider may have you take a test called an ambulatory electrocardiogram (ECG). An ECG records your heartbeat patterns over a 24-hour period. You may also have other tests, such as: °· Transthoracic echocardiogram (TTE). During echocardiography, sound waves are used to evaluate how blood flows through your heart. °· Transesophageal echocardiogram (TEE). °· Cardiac monitoring. This allows your health care provider to monitor your heart rate and rhythm in real time. °· Holter monitor. This is a portable device that records your heartbeat and can help diagnose heart arrhythmias. It allows your health care provider to track your heart activity for several days, if needed. °· Stress tests by exercise or by giving medicine that makes the heart beat faster. °TREATMENT  °· Treatment depends on what may be causing your chest pain. Treatment may include: °· Acid blockers for  heartburn. °· Anti-inflammatory medicine. °· Pain medicine for inflammatory conditions. °· Antibiotics if an infection is present. °· You may be advised to change lifestyle habits. This includes stopping smoking and avoiding alcohol, caffeine, and chocolate. °· You may be advised to keep your head raised (elevated) when sleeping. This reduces the chance of acid going backward from your stomach into your esophagus. °Most of the time, nonspecific chest pain will improve within 2-3 days with rest and mild pain medicine.  °HOME CARE INSTRUCTIONS  °· If antibiotics were prescribed, take them as directed. Finish them even if you start to feel better. °· For the next few days, avoid physical activities that bring on chest pain. Continue physical activities as directed. °· Do not use any tobacco products, including cigarettes, chewing tobacco, or electronic cigarettes. °· Avoid drinking alcohol. °· Only take medicine as directed by your health care provider. °· Follow your health care provider's suggestions for further testing if your chest pain does not go away. °· Keep any follow-up appointments you made. If you do not go to an appointment, you could develop lasting (chronic) problems with pain. If there is any problem keeping an appointment, call to reschedule. °SEEK MEDICAL CARE IF:  °· Your chest pain does not go away, even after treatment. °· You have a rash with blisters on your chest. °· You have a fever. °SEEK IMMEDIATE MEDICAL CARE IF:  °· You have increased chest pain or pain that spreads to your arm, neck, jaw, back, or abdomen. °· You have shortness of breath. °· You have an increasing cough, or you cough   up blood. °· You have severe back or abdominal pain. °· You feel nauseous or vomit. °· You have severe weakness. °· You faint. °· You have chills. °This is an emergency. Do not wait to see if the pain will go away. Get medical help at once. Call your local emergency services (911 in [place] Do not drive  yourself to the hospital. °MAKE SURE YOU:  °· Understand these instructions. °· Will watch your condition. °· Will get help right away if you are not doing well or get worse. °Document Released: 05/30/2005 Document Revised: 08/25/2013 Document Reviewed: 03/25/2008 °ExitCare® Patient Information ©2015 [employer]. This information is not intended to replace advice given to you by your health care provider. Make sure you discuss any questions you have with your health care provider. °Shortness of Breath °Shortness of breath means you have trouble breathing. It could also mean that you have a medical problem. You should get immediate medical care for shortness of breath. °CAUSES  °· Not enough oxygen in the air such as with high altitudes or a smoke-filled room. °· Certain lung diseases, infections, or problems. °· Heart disease or conditions, such as angina or heart failure. °· Low red blood cells (anemia). °· Poor physical fitness, which can cause shortness of breath when you exercise. °· Chest or back injuries or stiffness. °· Being overweight. °· Smoking. °· Anxiety, which can make you feel like you are not getting enough air. °DIAGNOSIS  °Serious medical problems can often be found during your physical exam. Tests may also be done to determine why you are having shortness of breath. Tests may include: °· Chest X-rays. °· Lung function tests. °· Blood tests. °· An electrocardiogram (ECG). °· An ambulatory electrocardiogram. An ambulatory ECG records your heartbeat patterns over a 24-hour period. °· Exercise testing. °· A transthoracic echocardiogram (TTE). During echocardiography, sound waves are used to evaluate how blood flows through your heart. °· A transesophageal echocardiogram (TEE). °· Imaging scans. °Your health care provider may not be able to find a cause for your shortness of breath after your exam. In this case, it is important to have a follow-up exam with your health care provider as directed.    °TREATMENT  °Treatment for shortness of breath depends on the cause of your symptoms and can vary greatly. °HOME CARE INSTRUCTIONS  °· Do not smoke. Smoking is a common cause of shortness of breath. If you smoke, ask for help to quit. °· Avoid being around chemicals or things that may bother your breathing, such as paint fumes and dust. °· Rest as needed. Slowly resume your usual activities. °· If medicines were prescribed, take them as directed for the full length of time directed. This includes oxygen and any inhaled medicines. °· Keep all follow-up appointments as directed by your health care provider. °SEEK MEDICAL CARE IF:  °· Your condition does not improve in the time expected. °· You have a hard time doing your normal activities even with rest. °· You have any new symptoms. °SEEK IMMEDIATE MEDICAL CARE IF:  °· Your shortness of breath gets worse. °· You feel light-headed, faint, or develop a cough not controlled with medicines. °· You start coughing up blood. °· You have pain with breathing. °· You have chest pain or pain in your arms, shoulders, or abdomen. °· You have a fever. °· You are unable to walk up stairs or exercise the way you normally do. °MAKE SURE YOU: °· Understand these instructions. °· Will watch your condition. °·   Will get help right away if you are not doing well or get worse. °Document Released: 05/15/2001 Document Revised: 08/25/2013 Document Reviewed: 11/05/2011 °ExitCare® Patient Information ©2015 [employer]. This information is not intended to replace advice given to you by your health care provider. Make sure you discuss any questions you have with your health care provider. ° °

## 2014-04-16 NOTE — ED Provider Notes (Signed)
Medical screening examination/treatment/procedure(s) were performed by non-physician practitioner and as supervising physician I was immediately available for consultation/collaboration.   EKG Interpretation None        Lyndol Vanderheiden F Daveion Robar, MD 04/16/14 2007 

## 2014-06-01 ENCOUNTER — Ambulatory Visit: Payer: Self-pay | Admitting: Cardiovascular Disease

## 2014-07-25 NOTE — Progress Notes (Signed)
Patient ID: Shelby Smith, female   DOB: 22-Aug-1986, 28 y.o.   MRN: [id number] 28 yo previously seen by Dr Shelby Smith for postpartum DCM 2011  EF 45-50%.  Suspect it was more related to HTN.  F/U echo  3/14 with normal EF 55% and no valve disease or pulmonary hypertension.  History of benign palpitations Seen in ER 8/15 with chest pain dyspnea.  Negative w/u with normal enzymes, BNP, CXR and d dimer Recent shoulder surgery still needing rehab.  Has some LE edema occasional tachycardia and dyspnea  Her echo images have been suboptima in past.  She denies SSCP syncope  Compliant with meds  Daughter Shelby Smith is now 28 years old Working at [employer] [place] and living with [place]    ROS: Denies fever, malais, weight loss, blurry vision, decreased visual acuity, cough, sputum, SOB, hemoptysis, pleuritic pain, palpitaitons, heartburn, abdominal pain, melena, lower extremity edema, claudication, or rash.  All other systems reviewed and negative  General: Affect appropriate Healthy:  appears stated age HEENT: normal Neck supple with no adenopathy JVP normal no bruits no thyromegaly Lungs clear with no wheezing and good diaphragmatic motion Heart:  S1/S2 no murmur, no rub, gallop or click PMI normal Abdomen: benighn, BS positve, no tenderness, no AAA no bruit.  No HSM or HJR Distal pulses intact with no bruits No edema Neuro non-focal Skin warm and dry No muscular weakness   Current Outpatient Prescriptions  Medication Sig Dispense Refill  . carvedilol (COREG) 6.25 MG tablet Take 1 tablet (6.25 mg total) by mouth 2 (two) times daily. 60 tablet 1  . furosemide (LASIX) 20 MG tablet Take 1 tablet (20 mg total) by mouth daily. 30 tablet 1  . lisinopril (PRINIVIL,ZESTRIL) 10 MG tablet Take 1 tablet (10 mg total) by mouth daily. 30 tablet 2  . PARoxetine (PAXIL) 20 MG tablet Take 20 mg by mouth daily.     No current facility-administered medications for this visit.     Allergies  Celestone  Electrocardiogram:  SR ate 66 normal 8/15 read as possible anterior MI but R wave progression mildly abnormal   Assessment and Plan

## 2014-07-26 ENCOUNTER — Encounter: Payer: Self-pay | Admitting: Cardiovascular Disease

## 2014-07-26 ENCOUNTER — Ambulatory Visit (INDEPENDENT_AMBULATORY_CARE_PROVIDER_SITE_OTHER): Payer: BC Managed Care – PPO | Admitting: Cardiovascular Disease

## 2014-07-26 VITALS — BP 122/76 | HR 94 | Ht 69.0 in | Wt 199.8 lb

## 2014-07-26 DIAGNOSIS — I429 Cardiomyopathy, unspecified: Secondary | ICD-10-CM

## 2014-07-26 NOTE — Patient Instructions (Signed)
Your physician wants you to follow-up in:   YEAR WITH  DR Haywood FillerNISHAN You will receive a reminder letter in the mail two months in advance. If you don't receive a letter, please call our office to schedule the follow-up appointment. Your physician recommends that you continue on your current medications as directed. Please refer to the Current Medication list given to you today.  Your physician has requested that you have a cardiac MRI. Cardiac MRI uses a computer to create images of your heart as its beating, producing both still and moving pictures of your heart and major blood vessels. For further information please visit [email or web address]. Please follow the instruction sheet given to you today for more information.

## 2014-07-26 NOTE — Assessment & Plan Note (Signed)
Well controlled.  Continue current medications and low sodium Dash type diet.    

## 2014-07-26 NOTE — Assessment & Plan Note (Signed)
Not clear what to make of dyspnea and tachycardia  Has not had EF checked in a year and a half  Echo images are suboptimal Cardiac MRI [id number] non contrast for Quantitative EF and strain  Continue diuretic ACE and beta blocker

## 2014-07-27 ENCOUNTER — Encounter: Payer: Self-pay | Admitting: Cardiovascular Disease

## 2014-08-04 ENCOUNTER — Ambulatory Visit (HOSPITAL_COMMUNITY)
Admission: RE | Admit: 2014-08-04 | Discharge: 2014-08-04 | Disposition: A | Discharge status: Home / Self Care | Payer: BC Managed Care – PPO | Source: Ambulatory Visit | Attending: Cardiovascular Disease | Admitting: Cardiovascular Disease

## 2014-08-04 DIAGNOSIS — I429 Cardiomyopathy, unspecified: Secondary | ICD-10-CM

## 2014-08-05 ENCOUNTER — Telehealth: Payer: Self-pay | Admitting: Cardiovascular Disease

## 2014-08-05 NOTE — Telephone Encounter (Signed)
New Msg  Pt calling for MRI results, please contact at [phone].

## 2014-08-05 NOTE — Telephone Encounter (Signed)
SEE DOCUMENTATION  ON  MRI RESULTS .Shelby Smith/CY

## 2014-09-19 ENCOUNTER — Other Ambulatory Visit: Payer: Self-pay | Admitting: Cardiovascular Disease

## 2014-12-14 ENCOUNTER — Emergency Department (HOSPITAL_COMMUNITY): Payer: BLUE CROSS/BLUE SHIELD

## 2014-12-14 ENCOUNTER — Encounter (HOSPITAL_COMMUNITY): Payer: Self-pay

## 2014-12-14 ENCOUNTER — Emergency Department (HOSPITAL_COMMUNITY)
Admission: EM | Admit: 2014-12-14 | Discharge: 2014-12-14 | Disposition: A | Discharge status: Home / Self Care | Payer: BLUE CROSS/BLUE SHIELD | Attending: Emergency Medicine | Admitting: Emergency Medicine

## 2014-12-14 DIAGNOSIS — I10 Essential (primary) hypertension: Secondary | ICD-10-CM | POA: Diagnosis not present

## 2014-12-14 DIAGNOSIS — R079 Chest pain, unspecified: Secondary | ICD-10-CM | POA: Diagnosis present

## 2014-12-14 DIAGNOSIS — Z87891 Personal history of nicotine dependence: Secondary | ICD-10-CM | POA: Diagnosis not present

## 2014-12-14 DIAGNOSIS — I5022 Chronic systolic (congestive) heart failure: Secondary | ICD-10-CM | POA: Diagnosis not present

## 2014-12-14 DIAGNOSIS — Z872 Personal history of diseases of the skin and subcutaneous tissue: Secondary | ICD-10-CM | POA: Diagnosis not present

## 2014-12-14 DIAGNOSIS — R0789 Other chest pain: Secondary | ICD-10-CM | POA: Insufficient documentation

## 2014-12-14 DIAGNOSIS — Z79899 Other long term (current) drug therapy: Secondary | ICD-10-CM | POA: Diagnosis not present

## 2014-12-14 LAB — BASIC METABOLIC PANEL
Anion gap: 12 (ref 5–15)
BUN: 14 mg/dL (ref 6–23)
CALCIUM: 9.3 mg/dL (ref 8.4–10.5)
CO2: 23 mmol/L (ref 19–32)
CREATININE: 0.7 mg/dL (ref 0.50–1.10)
Chloride: 101 mmol/L (ref 96–112)
GFR calc Af Amer: >90 mL/min (ref >90)
GFR calc non Af Amer: >90 mL/min (ref >90)
Glucose, Bld: 103 mg/dL — ABNORMAL HIGH (ref 70–99)
Potassium: 3.5 mmol/L (ref 3.5–5.1)
Sodium: 136 mmol/L (ref 135–145)

## 2014-12-14 LAB — CBC WITH DIFFERENTIAL/PLATELET
BASOS PCT: 0 % (ref 0–1)
Basophils Absolute: 0 10*3/uL (ref 0.0–0.1)
EOS ABS: 0.1 10*3/uL (ref 0.0–0.7)
Eosinophils Relative: 1 % (ref 0–5)
HCT: 41 % (ref 36.0–46.0)
Hemoglobin: 13.8 g/dL (ref 12.0–15.0)
LYMPHS PCT: 36 % (ref 12–46)
Lymphs Abs: 4.2 10*3/uL — ABNORMAL HIGH (ref 0.7–4.0)
MCH: 29.9 pg (ref 26.0–34.0)
MCHC: 33.7 g/dL (ref 30.0–36.0)
MCV: 88.7 fL (ref 78.0–100.0)
Monocytes Absolute: 0.7 10*3/uL (ref 0.1–1.0)
Monocytes Relative: 6 % (ref 3–12)
Neutro Abs: 6.7 10*3/uL (ref 1.7–7.7)
Neutrophils Relative %: 57 % (ref 43–77)
PLATELETS: 253 10*3/uL (ref 150–400)
RBC: 4.62 MIL/uL (ref 3.87–5.11)
RDW: 13.2 % (ref 11.5–15.5)
WBC: 11.6 10*3/uL — ABNORMAL HIGH (ref 4.0–10.5)

## 2014-12-14 LAB — I-STAT TROPONIN, ED: TROPONIN I, POC: 0 ng/mL (ref 0.00–0.08)

## 2014-12-14 LAB — BRAIN NATRIURETIC PEPTIDE: B Natriuretic Peptide: 5.4 pg/mL (ref 0.0–100.0)

## 2014-12-14 MED ORDER — FUROSEMIDE 20 MG PO TABS: 20.0000 mg | ORAL_TABLET | Freq: Every day | ORAL | Status: DC

## 2014-12-14 NOTE — ED Provider Notes (Signed)
CSN: [id number]     Arrival date & time 12/14/14  2046 History   First MD Initiated Contact with Patient 12/14/14 2048     Chief Complaint  Patient presents with  . Chest Pain     (Consider location/radiation/quality/duration/timing/severity/associated sxs/prior Treatment) HPI Patient presents with concern of dyspnea, chest pain. Pain is left-sided, upper and mid. Pain is sore, moderate. Pain began while the patient was at rest. Patient has had increased dyspnea as well. Pain is improved somewhat with nitroglycerin, aspirin. Patient has recently noticed some weight gain, and has taken increased amounts of Lasix. She denies fever, chills, nausea, vomiting, abdominal pain. Patient's heart failure is attributed to cardiomyopathy of pregnancy.   Past Medical History  Diagnosis Date  . Cardiomyopathy, peripartum     12/11.  Echocardiogram 12/11: EF 45-50%  . HTN (hypertension)   . Chest pain, unspecified   . Palpitations   . Chronic systolic heart failure   . Hidradenitis     vulvar   Past Surgical History  Procedure Laterality Date  . Dilation and curettage, diagnostic / therapeutic    . Eardrum repair    . Right knee lateral release     Family History  Problem Relation Age of Onset  . Heart attack Paternal Grandfather   . Coronary artery disease      family history  . Cancer      family history   History  Substance Use Topics  . Smoking status: Former Smoker    Quit date: 10/30/2008  . Smokeless tobacco: Not on file  . Alcohol Use: Yes     Comment: rarely   OB History    No data available     Review of Systems  Constitutional:       Per HPI, otherwise negative  HENT:       Per HPI, otherwise negative  Respiratory:       Per HPI, otherwise negative  Cardiovascular:       Per HPI, otherwise negative  Gastrointestinal: Negative for vomiting.  Endocrine:       Negative aside from HPI  Genitourinary:       Neg aside from HPI   Musculoskeletal:       Per  HPI, otherwise negative  Skin: Negative.   Neurological: Negative for syncope.      Allergies  Celestone  Home Medications   Prior to Admission medications   Medication Sig Start Date End Date Taking? Authorizing Provider  carvedilol (COREG) 6.25 MG tablet Take 1 tablet (6.25 mg total) by mouth 2 (two) times daily. 04/12/14   Wendall Stade, MD  clonazePAM (KLONOPIN) 1 MG tablet Take 1 mg by mouth at bedtime.    Historical Provider, MD  furosemide (LASIX) 20 MG tablet TAKE 1 TABLET BY MOUTH ONCE DAILY 09/20/14   Wendall Stade, MD  HYDROcodone-acetaminophen (NORCO/VICODIN) 5-325 MG per tablet Take 1 tablet by mouth every 6 (six) hours as needed for moderate pain.    Historical Provider, MD  lisinopril (PRINIVIL,ZESTRIL) 10 MG tablet Take 1 tablet (10 mg total) by mouth daily. 04/16/14   Wendall Stade, MD  methocarbamol (ROBAXIN) 500 MG tablet Take 500 mg by mouth daily.    Historical Provider, MD  PARoxetine (PAXIL) 20 MG tablet Take 20 mg by mouth daily.    Historical Provider, MD   There were no vitals taken for this visit. Physical Exam  Constitutional: She is oriented to person, place, and time. She appears well-developed and well-nourished.  No distress.  HENT:  Head: Normocephalic and atraumatic.  Eyes: Conjunctivae and EOM are normal.  Cardiovascular: Normal rate and regular rhythm.   Pulmonary/Chest: Effort normal. No stridor. No respiratory distress. She has decreased breath sounds.  Abdominal: She exhibits no distension.  Musculoskeletal: She exhibits no edema.  Neurological: She is alert and oriented to person, place, and time. No cranial nerve deficit.  Skin: Skin is warm and dry.  Psychiatric: She has a normal mood and affect.  Nursing note and vitals reviewed.   ED Course  Procedures (including critical care time) Labs Review Labs Reviewed  BASIC METABOLIC PANEL - Abnormal; Notable for the following:    Glucose, Bld 103 (*)    All other components within normal  limits  CBC WITH DIFFERENTIAL/PLATELET - Abnormal; Notable for the following:    WBC 11.6 (*)    Lymphs Abs 4.2 (*)    All other components within normal limits  BRAIN NATRIURETIC PEPTIDE  I-STAT TROPOININ, ED    Imaging Review Dg Chest 2 View  12/14/2014   CLINICAL DATA:  Hypertension. Chest pain and palpitations today. History of heart failure.  EXAM: CHEST  2 VIEW  COMPARISON:  04/16/2014  FINDINGS: Minimal pectus excavatum deformity. Midline trachea. Normal heart size and mediastinal contours. No pleural effusion or pneumothorax. Clear lungs. No congestive failure.  IMPRESSION: No acute cardiopulmonary disease.   Electronically Signed   By: Jeronimo GreavesKyle  Talbot M.D.   On: 12/14/2014 21:35     EKG Interpretation   Date/Time:  Tuesday December 14 2014 20:51:52 EDT Ventricular Rate:  79 PR Interval:  172 QRS Duration: 82 QT Interval:  494 QTC Calculation: 566 R Axis:   35 Text Interpretation:  Age not entered, assumed to be  29 years old for  purpose of ECG interpretation Sinus rhythm Borderline T abnormalities,  anterior leads Prolonged QT interval Sinus rhythm T wave abnormality QT  prolonged Abnormal ekg Confirmed by Gerhard MunchLOCKWOOD, Elvin Mccartin  MD ([id number]) on  12/14/2014 8:55:18 PM       11:00 PM Patient in no distress. We discussed all findings at length, with her mother present. Patient is not hypoxic, tachycardic or tachypnea. On monitors for several hours, and she has been fairly stable.   MDM   Final diagnoses:  Atypical chest pain   patient presents with chest pain, dyspnea for several hours. Here the patient is awake, alert, afebrile, not tachypneic, tachycardic, in no distress. Patient has a history of cardiomyopathy, likely contributing to her discomfort, but no evidence for ACS, or substantial heart failure decompensation. Patient has been inconsistent with Lasix dosing, and will be started on regular dosing ending outpatient cardiology follow-up.    Gerhard Munchobert Anhar Mcdermott,  MD 12/14/14 [phone]

## 2014-12-14 NOTE — Discharge Instructions (Signed)
As discussed, your evaluation today has been largely reassuring.  But, it is important that you monitor your condition carefully, and do not hesitate to return to the ED if you develop new, or concerning changes in your condition. ? ?Otherwise, please follow-up with your physician for appropriate ongoing care. ? ?

## 2014-12-14 NOTE — ED Notes (Signed)
Patient returned from X-ray 

## 2014-12-14 NOTE — ED Notes (Signed)
Per EMS, Patient started to have a sudden onset of left sided chest pain with radiation to the left abdomen. Patient describes pain as a sharp pressure that increases with deep inspiration. Reports dizziness when walking. Patient has history of lower extremity yesterday and took prescribed Lasix. Patient took 324 mg of Aspirin. 120 CC of NS Given during transport and two 0.4 mg Nitro SL that brought patient's pain from 7/10 to 5/10. Patient denies any nausea or vomiting. Patient alert and oriented upon arrival. Patient was diagnosed with CHF in 2011. Vitals per EMS: 118/93, 88 HR, 96% on RA.

## 2014-12-14 NOTE — ED Notes (Signed)
Phlebotomy at the bedside  

## 2014-12-17 ENCOUNTER — Ambulatory Visit (INDEPENDENT_AMBULATORY_CARE_PROVIDER_SITE_OTHER): Payer: BLUE CROSS/BLUE SHIELD | Admitting: Cardiology

## 2014-12-17 ENCOUNTER — Encounter: Payer: Self-pay | Admitting: Cardiology

## 2014-12-17 VITALS — BP 118/70 | HR 80 | Ht 69.0 in | Wt 197.0 lb

## 2014-12-17 DIAGNOSIS — I10 Essential (primary) hypertension: Secondary | ICD-10-CM

## 2014-12-17 DIAGNOSIS — R072 Precordial pain: Secondary | ICD-10-CM

## 2014-12-17 DIAGNOSIS — R0600 Dyspnea, unspecified: Secondary | ICD-10-CM

## 2014-12-17 DIAGNOSIS — I429 Cardiomyopathy, unspecified: Secondary | ICD-10-CM

## 2014-12-17 DIAGNOSIS — R002 Palpitations: Secondary | ICD-10-CM | POA: Diagnosis not present

## 2014-12-17 DIAGNOSIS — R0602 Shortness of breath: Secondary | ICD-10-CM

## 2014-12-17 LAB — BASIC METABOLIC PANEL WITH GFR
BUN: 15 mg/dL (ref 6–23)
CALCIUM: 10.2 mg/dL (ref 8.4–10.5)
CO2: 24 meq/L (ref 19–32)
Chloride: 101 mEq/L (ref 96–112)
Creat: 0.67 mg/dL (ref 0.50–1.10)
Glucose, Bld: 85 mg/dL (ref 70–99)
POTASSIUM: 4 meq/L (ref 3.5–5.3)
SODIUM: 138 meq/L (ref 135–145)

## 2014-12-17 LAB — CBC
HCT: 38.5 % (ref 36.0–46.0)
HEMOGLOBIN: 13.2 g/dL (ref 12.0–15.0)
MCH: 29.8 pg (ref 26.0–34.0)
MCHC: 34.3 g/dL (ref 30.0–36.0)
MCV: 86.9 fL (ref 78.0–100.0)
MPV: 11.6 fL (ref 8.6–12.4)
Platelets: 246 10*3/uL (ref 150–400)
RBC: 4.43 MIL/uL (ref 3.87–5.11)
RDW: 13.7 % (ref 11.5–15.5)
WBC: 14.2 10*3/uL — AB (ref 4.0–10.5)

## 2014-12-17 LAB — TSH: TSH: 2.218 u[IU]/mL (ref 0.350–4.500)

## 2014-12-17 NOTE — Assessment & Plan Note (Signed)
Symptoms atypical. No plans for further ischemia evaluation.

## 2014-12-17 NOTE — Assessment & Plan Note (Signed)
Blood pressure controlled. Continue present medications. 

## 2014-12-17 NOTE — Assessment & Plan Note (Signed)
Patient states her heart rate is in the 110-120 range in the morning. She complains of palpitations. Schedule 48 hour Holter monitor. Check TSH, hemoglobin, renal function.

## 2014-12-17 NOTE — Progress Notes (Signed)
      HPI: FU postpartum DCM 2011. EF 45-50%.F/U echo 3/14 with normal EF 55% and no valve disease or pulmonary hypertension. History of benign palpitations. Cardiac MRI December 2015 showed ejection fraction 70%. Normal right atrium and right ventricle. Patient also noted to have an exercise treadmill in May 2013 that was normal.Seen in the emergency room recently with chest pain. Chest x-ray negative. Troponin normal. Since last seen, she has dyspnea. She also has elevated heart rates predominantly in the mornings associated with palpitations. No syncope. She has chest pain with her palpitations.  Current Outpatient Prescriptions  Medication Sig Dispense Refill  . carvedilol (COREG) 6.25 MG tablet Take 1 tablet (6.25 mg total) by mouth 2 (two) times daily. 60 tablet 1  . clonazePAM (KLONOPIN) 1 MG tablet Take 0.5 mg by mouth at bedtime.     . furosemide (LASIX) 20 MG tablet Take 1 tablet (20 mg total) by mouth daily. 30 tablet 10  . lisinopril (PRINIVIL,ZESTRIL) 10 MG tablet Take 1 tablet (10 mg total) by mouth daily. 30 tablet 2  . methocarbamol (ROBAXIN) 500 MG tablet Take 500 mg by mouth every 6 (six) hours as needed for muscle spasms.     . montelukast (SINGULAIR) 10 MG tablet Take 10 mg by mouth at bedtime.    . Multiple Vitamin (MULTIVITAMIN WITH MINERALS) TABS tablet Take 2 tablets by mouth daily.    . Probiotic Product (PROBIOTIC DAILY PO) Take 1 tablet by mouth daily.     No current facility-administered medications for this visit.     Past Medical History  Diagnosis Date  . Cardiomyopathy, peripartum     12/11.  Echocardiogram 12/11: EF 45-50%  . HTN (hypertension)   . Chest pain, unspecified   . Palpitations   . Chronic systolic heart failure   . Hidradenitis     vulvar    Past Surgical History  Procedure Laterality Date  . Dilation and curettage, diagnostic / therapeutic    . Eardrum repair    . Right knee lateral release    . Right shoulder surgery  04/2014     History   Social History  . Marital Status: Single    Spouse Name: N/A  . Number of Children: N/A  . Years of Education: N/A   Occupational History  . Not on file.   Social History Main Topics  . Smoking status: Former Smoker    Quit date: 10/30/2008  . Smokeless tobacco: Not on file  . Alcohol Use: 0.0 oz/week    0 Standard drinks or equivalent per week     Comment: rarely  . Drug Use: No  . Sexual Activity: Not on file   Other Topics Concern  . Not on file   Social History Narrative    ROS: no fevers or chills, productive cough, hemoptysis, dysphasia, odynophagia, melena, hematochezia, dysuria, hematuria, rash, seizure activity, orthopnea, PND, pedal edema, claudication. Remaining systems are negative.  Physical Exam: Well-developed well-nourished in no acute distress.  Skin is warm and dry.  HEENT is normal.  Neck is supple.  Chest is clear to auscultation with normal expansion.  Cardiovascular exam is regular rate and rhythm.  Abdominal exam nontender or distended. No masses palpated. Extremities show no edema. neuro grossly intact  ECG 12/14/2014-sinus rhythm, prolonged QT interval, nonspecific ST changes.

## 2014-12-17 NOTE — Assessment & Plan Note (Signed)
LV function normal.check BNP.

## 2014-12-17 NOTE — Assessment & Plan Note (Signed)
LV function improved. Continue ACE inhibitor and beta blocker.

## 2014-12-17 NOTE — Patient Instructions (Addendum)
Your physician recommends that you schedule a follow-up appointment in: 3 MONTHS WITH DR Jens SomRENSHAW  Your physician recommends that you return for lab work WHEN ABLE  Your physician has recommended that you wear a 48 HOUR holter monitor. Holter monitors are medical devices that record the heart's electrical activity. Doctors most often use these monitors to diagnose arrhythmias. Arrhythmias are problems with the speed or rhythm of the heartbeat. The monitor is a small, portable device. You can wear one while you do your normal daily activities. This is usually used to diagnose what is causing palpitations/syncope (passing out).[facility]

## 2014-12-18 LAB — BRAIN NATRIURETIC PEPTIDE: Brain Natriuretic Peptide: 5.1 pg/mL (ref 0.0–100.0)

## 2014-12-20 ENCOUNTER — Encounter (INDEPENDENT_AMBULATORY_CARE_PROVIDER_SITE_OTHER): Payer: BLUE CROSS/BLUE SHIELD

## 2014-12-20 ENCOUNTER — Encounter: Payer: Self-pay | Admitting: Radiology

## 2014-12-20 DIAGNOSIS — R002 Palpitations: Secondary | ICD-10-CM | POA: Diagnosis not present

## 2014-12-20 DIAGNOSIS — R0600 Dyspnea, unspecified: Secondary | ICD-10-CM

## 2014-12-20 NOTE — Progress Notes (Signed)
Patient ID: Shelby Smith, female   DOB: [birth year], 29 y.o.   MRN: [id number] [employer] 48hr holter applied.

## 2014-12-30 ENCOUNTER — Telehealth: Payer: Self-pay | Admitting: *Deleted

## 2014-12-30 NOTE — Telephone Encounter (Signed)
Spoke with pt, Monitor reviewed by dr Jens Somcrenshaw shows sinus to sinus tach with pac's/pvc's.  Questions answered

## 2015-01-15 ENCOUNTER — Other Ambulatory Visit: Payer: Self-pay | Admitting: Cardiovascular Disease

## 2015-01-23 ENCOUNTER — Other Ambulatory Visit: Payer: Self-pay | Admitting: Cardiovascular Disease

## 2015-03-16 NOTE — Progress Notes (Signed)
      HPI: FU postpartum DCM 2011. EF 45-50%.F/U echo 3/14 with normal EF 55% and no valve disease or pulmonary hypertension. History of benign palpitations. Cardiac MRI December 2015 showed ejection fraction 70%. Normal right atrium and right ventricle. Patient also noted to have an exercise treadmill in May 2013 that was normal. Holter monitor April 2016 showed sinus to sinus tachycardia with PACs and PVCs. Since last seen, she notes some dyspnea on exertion but no orthopnea, PND, pedal edema, chest pain or syncope.  Current Outpatient Prescriptions  Medication Sig Dispense Refill  . carvedilol (COREG) 6.25 MG tablet TAKE 1 TABLET BY MOUTH TWICE A DAY 60 tablet 1  . clonazePAM (KLONOPIN) 1 MG tablet Take 0.5 mg by mouth at bedtime.     . furosemide (LASIX) 20 MG tablet Take 1 tablet (20 mg total) by mouth daily. 30 tablet 10  . Multiple Vitamin (MULTIVITAMIN WITH MINERALS) TABS tablet Take 2 tablets by mouth daily.    . Probiotic Product (PROBIOTIC DAILY PO) Take 1 tablet by mouth daily.     No current facility-administered medications for this visit.     Past Medical History  Diagnosis Date  . Cardiomyopathy, peripartum     12/11.  Echocardiogram 12/11: EF 45-50%  . HTN (hypertension)   . Chest pain, unspecified   . Palpitations   . Chronic systolic heart failure   . Hidradenitis     vulvar    Past Surgical History  Procedure Laterality Date  . Dilation and curettage, diagnostic / therapeutic    . Eardrum repair    . Right knee lateral release    . Right shoulder surgery  04/2014    History   Social History  . Marital Status: Single    Spouse Name: N/A  . Number of Children: N/A  . Years of Education: N/A   Occupational History  . Not on file.   Social History Main Topics  . Smoking status: Former Smoker    Quit date: 10/30/2008  . Smokeless tobacco: Not on file  . Alcohol Use: 0.0 oz/week    0 Standard drinks or equivalent per week     Comment: rarely    . Drug Use: No  . Sexual Activity: Not on file   Other Topics Concern  . Not on file   Social History Narrative    ROS: no fevers or chills, productive cough, hemoptysis, dysphasia, odynophagia, melena, hematochezia, dysuria, hematuria, rash, seizure activity, orthopnea, PND, pedal edema, claudication. Remaining systems are negative.  Physical Exam: Well-developed well-nourished in no acute distress.  Skin is warm and dry.  HEENT is normal.  Neck is supple.  Chest is clear to auscultation with normal expansion.  Cardiovascular exam is regular rate and rhythm.  Abdominal exam nontender or distended. No masses palpated. Extremities show no edema. neuro grossly intact

## 2015-03-21 ENCOUNTER — Ambulatory Visit (INDEPENDENT_AMBULATORY_CARE_PROVIDER_SITE_OTHER): Payer: BLUE CROSS/BLUE SHIELD | Admitting: Cardiology

## 2015-03-21 ENCOUNTER — Encounter: Payer: Self-pay | Admitting: Cardiology

## 2015-03-21 VITALS — BP 118/79 | HR 85 | Ht 69.0 in | Wt 201.0 lb

## 2015-03-21 DIAGNOSIS — I10 Essential (primary) hypertension: Secondary | ICD-10-CM

## 2015-03-21 DIAGNOSIS — I429 Cardiomyopathy, unspecified: Secondary | ICD-10-CM | POA: Diagnosis not present

## 2015-03-21 DIAGNOSIS — D72829 Elevated white blood cell count, unspecified: Secondary | ICD-10-CM

## 2015-03-21 MED ORDER — LISINOPRIL 2.5 MG PO TABS: 2.5000 mg | ORAL_TABLET | Freq: Every day | ORAL | Status: DC

## 2015-03-21 NOTE — Assessment & Plan Note (Signed)
Blood pressure controlled. Continue present medications. 

## 2015-03-21 NOTE — Assessment & Plan Note (Signed)
LV function normalized on most recent evaluation. Continue beta blocker. 10 mg of lisinopril apparently caused hypotension. We will resume at 2.5 mg daily.

## 2015-03-21 NOTE — Patient Instructions (Signed)
Your physician wants you to follow-up in: ONE YEAR WITH DR Shelda PalRENSHAW You will receive a reminder letter in the mail two months in advance. If you don't receive a letter, please call our office to schedule the follow-up appointment.   START LISINOPRIL 2.5 MG ONCE DAILY

## 2015-03-21 NOTE — Assessment & Plan Note (Signed)
Recent white blood cell count mildly elevated.I will repeat.

## 2015-03-24 ENCOUNTER — Other Ambulatory Visit: Payer: Self-pay | Admitting: *Deleted

## 2015-03-24 DIAGNOSIS — D72829 Elevated white blood cell count, unspecified: Secondary | ICD-10-CM

## 2015-04-08 LAB — CBC
HCT: 39.9 % (ref 36.0–46.0)
Hemoglobin: 13.4 g/dL (ref 12.0–15.0)
MCH: 29.6 pg (ref 26.0–34.0)
MCHC: 33.6 g/dL (ref 30.0–36.0)
MCV: 88.3 fL (ref 78.0–100.0)
MPV: 11.6 fL (ref 8.6–12.4)
Platelets: 263 10*3/uL (ref 150–400)
RBC: 4.52 MIL/uL (ref 3.87–5.11)
RDW: 14.2 % (ref 11.5–15.5)
WBC: 11.9 10*3/uL — ABNORMAL HIGH (ref 4.0–10.5)

## 2016-08-08 ENCOUNTER — Telehealth: Payer: Self-pay | Admitting: Cardiology

## 2016-08-08 NOTE — Telephone Encounter (Signed)
Pt c/o BP issue:  1. What are your last 5 BP readings? Only had the last one 144/98 2. Are you having any other symptoms (ex. Dizziness, headache, blurred vision, passed out)? Pain in head-doesn't feel like headache-upper back pressure 3. What is your medication issue? Pt went off because BP went to low for about 2 years, started back yesterday since it was high

## 2016-08-08 NOTE — Telephone Encounter (Signed)
Spoke with pt, for the last couple days she has noticed headache and flushed feeling. Her bp was elevated so she restarted the lisinopril 2.5 mg. bp recheck this afternoon was 133/89. She has taken aleve for the headache with little effect. She has also taken a furosemide and notes increased urination than her usual. She denies swelling or SOB but does state she feels like she can not exhale completely. She is anxious and would like to be seen. F/u scheduled with APP at her convenience.

## 2016-08-10 ENCOUNTER — Encounter: Payer: Self-pay | Admitting: Cardiology

## 2016-08-10 ENCOUNTER — Ambulatory Visit (INDEPENDENT_AMBULATORY_CARE_PROVIDER_SITE_OTHER): Payer: BLUE CROSS/BLUE SHIELD | Admitting: Cardiology

## 2016-08-10 VITALS — BP 131/78 | HR 71 | Ht 69.0 in | Wt 156.0 lb

## 2016-08-10 DIAGNOSIS — R5383 Other fatigue: Secondary | ICD-10-CM

## 2016-08-10 DIAGNOSIS — I10 Essential (primary) hypertension: Secondary | ICD-10-CM | POA: Diagnosis not present

## 2016-08-10 DIAGNOSIS — I429 Cardiomyopathy, unspecified: Secondary | ICD-10-CM

## 2016-08-10 NOTE — Assessment & Plan Note (Signed)
Seen today for new HTN

## 2016-08-10 NOTE — Progress Notes (Signed)
08/10/2016 Shelby HoppingAmanda Smith   20-Jun-1986  [id number]  Primary Physician Mitzi HansenANIEL,[name], NP Primary Cardiologist: Dr Jens Somrenshaw  HPI:  30 y/o female with a history of post partum CM in 2011. This subsequently resolved. She says she stopped her medication over a year ago. Around that time she was placed on Adderall by her PCP for fatigue. The pt is in the office today with complaints of elevated B/P and headache for the past few weeks. She resumed her Lasix, Coreg, and Lisinopril on her own. Her B/P is 131/78 today.  She of course was concerned something was wrong with her heart.    Current Outpatient Prescriptions  Medication Sig Dispense Refill  . carvedilol (COREG) 6.25 MG tablet TAKE 1 TABLET BY MOUTH TWICE A DAY 60 tablet 1  . clonazePAM (KLONOPIN) 1 MG tablet Take 0.5 mg by mouth at bedtime as needed.     [name]. lisinopril (PRINIVIL,ZESTRIL) 2.5 MG tablet Take 2.5 mg by mouth daily.    . Multiple Vitamin (MULTIVITAMIN WITH MINERALS) TABS tablet Take 2 tablets by mouth daily.    . Probiotic Product (PROBIOTIC DAILY PO) Take 1 tablet by mouth daily.     No current facility-administered medications for this visit.     Allergies  Allergen Reactions  . Celestone [Betamethasone] Other (See Comments)    headache    Social History   Social History  . Marital status: Single    Spouse name: N/A  . Number of children: N/A  . Years of education: N/A   Occupational History  . Not on file.   Social History Main Topics  . Smoking status: Former Smoker    Quit date: 10/30/2008  . Smokeless tobacco: Not on file  . Alcohol use 0.0 oz/week     Comment: rarely  . Drug use: No  . Sexual activity: Not on file   Other Topics Concern  . Not on file   Social History Narrative  . No narrative on file     Review of Systems: General: negative for chills, fever, night sweats or weight changes.  Cardiovascular: negative for chest pain, dyspnea on exertion, edema, orthopnea, palpitations,  paroxysmal nocturnal dyspnea or shortness of breath Dermatological: negative for rash Respiratory: negative for cough or wheezing Urologic: negative for hematuria Abdominal: negative for nausea, vomiting, diarrhea, bright red blood per rectum, melena, or hematemesis Neurologic: negative for visual changes, syncope, or dizziness All other systems reviewed and are otherwise negative except as noted above.    Blood pressure 131/78, pulse 71, height 5\' 9"  (1.753 m), weight 156 lb (70.8 kg).  General appearance: alert, cooperative and no distress Neck: no carotid bruit and no JVD Lungs: clear to auscultation bilaterally Heart: regular rate and rhythm Extremities: extremities normal, atraumatic, no cyanosis or edema Pulses: 2+ and symmetric Skin: Skin color, texture, turgor normal. No rashes or lesions Neurologic: Grossly normal  EKG [id number]  ASSESSMENT AND PLAN:   Essential hypertension Seen today for new HTN  Fatigue Currently on Adderall  Secondary cardiomyopathy Post partum CM in 2011. F/U echo in 2014 and cardiac MRI in 2015 showed resolution of her CM   PLAN  I suggested she discuss stopping her Adderall with her PCP. I reassured her that she did not have CHF on exam and I thought it would be unlikely for her to develop another cardiomyopathy. I also suggested she stop her Lasix but continue Coreg and Lisinopril for now. If her B/P remains elevated off Adderall she may need a 24  hr urine for catacholamines and metanephrine.   Corine ShelterLuke Lexy Meininger PA-C 08/10/2016 3:27 PM

## 2016-08-10 NOTE — Assessment & Plan Note (Signed)
Post partum CM in 2011. F/U echo in 2014 and cardiac MRI in 2015 showed resolution of her CM

## 2016-08-10 NOTE — Patient Instructions (Signed)
Medication Instructions: STOP Adderrall and Lasix  Continue Carvedilol and Lisinopril   Follow-Up: Your physician recommends that you schedule a follow-up appointment in: 2 weeks with Dr. Jens Somrenshaw (only) for BP follow-up.  If you need a refill on your cardiac medications before your next appointment, please call your pharmacy.

## 2016-08-10 NOTE — Assessment & Plan Note (Signed)
Currently on Adderall

## 2016-08-28 NOTE — Progress Notes (Signed)
HPI: FU postpartum DCM 2011. EF 45-50%.F/U echo 3/14 with normal EF 55% and no valve disease or pulmonary hypertension. History of benign palpitations. Patient also noted to have an exercise treadmill in May 2013 that was normal. Cardiac MRI December 2015 showed ejection fraction 70%. Normal right atrium and right ventricle. Holter monitor April 2016 showed sinus to sinus tachycardia with PACs and PVCs. Patient seen recently for worsening hypertension. Meds resumed. Since last seen, she has some fatigue but no dyspnea, chest pain, palpitations or syncope. She states her blood pressure is running high at times.  Current Outpatient Prescriptions  Medication Sig Dispense Refill  . carvedilol (COREG) 6.25 MG tablet TAKE 1 TABLET BY MOUTH TWICE A DAY 60 tablet 1  . clonazePAM (KLONOPIN) 1 MG tablet Take 0.5 mg by mouth at bedtime as needed.     [name]. lisinopril (PRINIVIL,ZESTRIL) 2.5 MG tablet Take 2.5 mg by mouth daily.    . Multiple Vitamin (MULTIVITAMIN WITH MINERALS) TABS tablet Take 2 tablets by mouth daily.    . Probiotic Product (PROBIOTIC DAILY PO) Take 1 tablet by mouth daily.     No current facility-administered medications for this visit.      Past Medical History:  Diagnosis Date  . Cardiomyopathy, peripartum    12/11.  Echocardiogram 12/11: EF 45-50%  . Chest pain, unspecified   . Chronic systolic heart failure (HCC)   . Hidradenitis    vulvar  . HTN (hypertension)   . Palpitations     Past Surgical History:  Procedure Laterality Date  . DILATION AND CURETTAGE, DIAGNOSTIC / THERAPEUTIC    . eardrum repair    . right knee lateral release    . Right shoulder surgery  04/2014    Social History   Social History  . Marital status: Single    Spouse name: N/A  . Number of children: N/A  . Years of education: N/A   Occupational History  . Not on file.   Social History Main Topics  . Smoking status: Former Smoker    Quit date: 10/30/2008  . Smokeless tobacco:  Never Used  . Alcohol use 0.0 oz/week     Comment: rarely  . Drug use: No  . Sexual activity: Not on file   Other Topics Concern  . Not on file   Social History Narrative  . No narrative on file    Family History  Problem Relation Age of Onset  . Heart attack Paternal Grandfather   . Coronary artery disease      family history  . Cancer      family history    ROS: no fevers or chills, productive cough, hemoptysis, dysphasia, odynophagia, melena, hematochezia, dysuria, hematuria, rash, seizure activity, orthopnea, PND, pedal edema, claudication. Remaining systems are negative.  Physical Exam: Well-developed well-nourished in no acute distress.  Skin is warm and dry.  HEENT is normal.  Neck is supple.  Chest is clear to auscultation with normal expansion.  Cardiovascular exam is regular rate and rhythm.  Abdominal exam nontender or distended. No masses palpated. Extremities show no edema. neuro grossly intact  A/P  1 Hypertension-blood pressure controlled. She states elevated occasionally at home. I have asked her to bring her cuff to correlate with ours. Continue present medications and adjust as needed. I have also explained that ACE inhibitors would need to be discontinued if she became pregnant in the future.   2 cardiomyopathy-LV function improved on most recent echocardiogram. Continue ACE inhibitor and  beta blocker. She should not have subsequent pregnancies.    3 mildly elevated white blood cell count-patient instructed previously to follow-up with primary care.  Olga MillersBrian Nailyn Dearinger, MD

## 2016-09-06 ENCOUNTER — Ambulatory Visit (INDEPENDENT_AMBULATORY_CARE_PROVIDER_SITE_OTHER): Payer: BLUE CROSS/BLUE SHIELD | Admitting: Cardiology

## 2016-09-06 ENCOUNTER — Encounter: Payer: Self-pay | Admitting: Cardiology

## 2016-09-06 VITALS — BP 120/72 | HR 85 | Ht 69.0 in | Wt 159.0 lb

## 2016-09-06 DIAGNOSIS — I429 Cardiomyopathy, unspecified: Secondary | ICD-10-CM | POA: Diagnosis not present

## 2016-09-06 DIAGNOSIS — I10 Essential (primary) hypertension: Secondary | ICD-10-CM | POA: Diagnosis not present

## 2016-09-06 NOTE — Patient Instructions (Signed)
Your physician recommends that you schedule a follow-up appointment in: 6 months with Dr. Jens Somrenshaw   Bring your blood pressure cuff by at your convenience to make sure it is accurate.

## 2019-02-05 ENCOUNTER — Encounter (HOSPITAL_BASED_OUTPATIENT_CLINIC_OR_DEPARTMENT_OTHER): Payer: Self-pay | Admitting: Emergency Medicine

## 2019-02-05 ENCOUNTER — Emergency Department (HOSPITAL_BASED_OUTPATIENT_CLINIC_OR_DEPARTMENT_OTHER): Payer: No Typology Code available for payment source

## 2019-02-05 ENCOUNTER — Emergency Department (HOSPITAL_BASED_OUTPATIENT_CLINIC_OR_DEPARTMENT_OTHER)
Admission: EM | Admit: 2019-02-05 | Discharge: 2019-02-05 | Disposition: A | Discharge status: Home / Self Care | Payer: No Typology Code available for payment source | Attending: Emergency Medicine | Admitting: Emergency Medicine

## 2019-02-05 ENCOUNTER — Other Ambulatory Visit: Payer: Self-pay

## 2019-02-05 DIAGNOSIS — Z87891 Personal history of nicotine dependence: Secondary | ICD-10-CM | POA: Insufficient documentation

## 2019-02-05 DIAGNOSIS — I5022 Chronic systolic (congestive) heart failure: Secondary | ICD-10-CM | POA: Insufficient documentation

## 2019-02-05 DIAGNOSIS — R1032 Left lower quadrant pain: Secondary | ICD-10-CM | POA: Diagnosis present

## 2019-02-05 DIAGNOSIS — N201 Calculus of ureter: Secondary | ICD-10-CM | POA: Diagnosis not present

## 2019-02-05 DIAGNOSIS — Z79899 Other long term (current) drug therapy: Secondary | ICD-10-CM | POA: Insufficient documentation

## 2019-02-05 DIAGNOSIS — I110 Hypertensive heart disease with heart failure: Secondary | ICD-10-CM | POA: Insufficient documentation

## 2019-02-05 HISTORY — DX: Anxiety disorder, unspecified: F41.9

## 2019-02-05 LAB — URINALYSIS, MICROSCOPIC (REFLEX)

## 2019-02-05 LAB — URINALYSIS, ROUTINE W REFLEX MICROSCOPIC
Bilirubin Urine: NEGATIVE
Glucose, UA: NEGATIVE mg/dL
Ketones, ur: >80 mg/dL — AB
Leukocytes,Ua: NEGATIVE
Nitrite: NEGATIVE
Protein, ur: NEGATIVE mg/dL
Specific Gravity, Urine: 1.025 (ref 1.005–1.030)
pH: 7 (ref 5.0–8.0)

## 2019-02-05 LAB — PREGNANCY, URINE: Preg Test, Ur: NEGATIVE

## 2019-02-05 MED ORDER — ACETAMINOPHEN 500 MG PO TABS
1000.0000 mg | ORAL_TABLET | Freq: Once | ORAL | Status: AC
  Administered 2019-02-05: 1000 mg via ORAL
  Filled 2019-02-05: qty 2

## 2019-02-05 MED ORDER — ONDANSETRON 8 MG PO TBDP: 8.0000 mg | ORAL_TABLET | Freq: Three times a day (TID) | ORAL | 0 refills | Status: AC | PRN

## 2019-02-05 MED ORDER — IBUPROFEN 600 MG PO TABS: 600.0000 mg | ORAL_TABLET | Freq: Three times a day (TID) | ORAL | 0 refills | Status: AC | PRN

## 2019-02-05 MED ORDER — OXYCODONE-ACETAMINOPHEN 5-325 MG PO TABS: 1.0000 | ORAL_TABLET | ORAL | 0 refills | Status: AC | PRN

## 2019-02-05 MED ORDER — MORPHINE SULFATE (PF) 4 MG/ML IV SOLN
4.0000 mg | Freq: Once | INTRAVENOUS | Status: AC
  Administered 2019-02-05: 4 mg via INTRAVENOUS
  Filled 2019-02-05: qty 1

## 2019-02-05 MED ORDER — KETOROLAC TROMETHAMINE 30 MG/ML IJ SOLN
30.0000 mg | Freq: Once | INTRAMUSCULAR | Status: AC
  Administered 2019-02-05: 30 mg via INTRAVENOUS
  Filled 2019-02-05: qty 1

## 2019-02-05 MED ORDER — SODIUM CHLORIDE 0.9 % IV BOLUS
1000.0000 mL | Freq: Once | INTRAVENOUS | Status: AC
  Administered 2019-02-05: 1000 mL via INTRAVENOUS

## 2019-02-05 MED ORDER — HYDROMORPHONE HCL 1 MG/ML IJ SOLN
1.0000 mg | Freq: Once | INTRAMUSCULAR | Status: AC
  Administered 2019-02-05: 1 mg via INTRAVENOUS
  Filled 2019-02-05: qty 1

## 2019-02-05 MED ORDER — ONDANSETRON HCL 4 MG/2ML IJ SOLN
4.0000 mg | Freq: Once | INTRAMUSCULAR | Status: AC
  Administered 2019-02-05: 4 mg via INTRAVENOUS
  Filled 2019-02-05: qty 2

## 2019-02-05 MED ORDER — TAMSULOSIN HCL 0.4 MG PO CAPS: 0.4000 mg | ORAL_CAPSULE | Freq: Every day | ORAL | 0 refills | Status: AC

## 2019-02-05 NOTE — ED Provider Notes (Signed)
[facility] EMERGENCY DEPARTMENT Provider Note   CSN: [id number] Arrival date & time: 02/05/19  2694    History   Chief Complaint Chief Complaint  Patient presents with  . Abdominal Pain    HPI Alanya Gildner is a 33 y.o. female.     HPI Patient is a 33 year old female presents the emergency department with complaints of severe acute onset left lower quadrant and left suprapubic pain without fever or chills.  Denies dysuria or urinary frequency.  Reports nausea and vomiting x1.  No flank pain.  No history of kidney stones.  Last normal menstrual cycle was 3 weeks ago.  No other complaints at this time.  Pain is severe in severity.   Past Medical History:  Diagnosis Date  . Anxiety   . Cardiomyopathy, peripartum    12/11.  Echocardiogram 12/11: EF 45-50%  . Chest pain, unspecified   . Chronic systolic heart failure (HCC)   . Hidradenitis    vulvar  . HTN (hypertension)   . Palpitations     Patient Active Problem List   Diagnosis Date Noted  . Leukocytosis 03/21/2015  . Fatigue 10/31/2011  . PALPITATIONS 02/16/2010  . DYSPNEA 01/31/2010  . Chest pain 01/31/2010  . Essential hypertension 12/02/2009  . 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 DILATION AND CURETTAGE, DIAGNOSTIC / THERAPEUTIC    . eardrum repair    . right knee lateral release    . Right shoulder surgery  04/2014     OB History   No obstetric history on file.      Home Medications    Prior to Admission medications   Medication Sig Start Date End Date Taking? Authorizing Provider  carvedilol (COREG) 6.25 MG tablet TAKE 1 TABLET BY MOUTH TWICE A DAY 01/17/15   Lewayne Bunting, MD  clonazePAM (KLONOPIN) 1 MG tablet Take 0.5 mg by mouth at bedtime as needed.     [provider]  ibuprofen (ADVIL) 600 MG tablet Take 1 tablet (600 mg total) by mouth every 8 (eight) hours as needed. 02/05/19   Azalia Bilis, MD  lisinopril  (PRINIVIL,ZESTRIL) 2.5 MG tablet Take 2.5 mg by mouth daily.    [provider]  Multiple Vitamin (MULTIVITAMIN WITH MINERALS) TABS tablet Take 2 tablets by mouth daily.    [provider]  ondansetron (ZOFRAN ODT) 8 MG disintegrating tablet Take 1 tablet (8 mg total) by mouth every 8 (eight) hours as needed for nausea or vomiting. 02/05/19   Azalia Bilis, MD  oxyCODONE-acetaminophen (PERCOCET/ROXICET) 5-325 MG tablet Take 1 tablet by mouth every 4 (four) hours as needed for severe pain. 02/05/19   Azalia Bilis, MD  Probiotic Product (PROBIOTIC DAILY PO) Take 1 tablet by mouth daily.    [provider]  tamsulosin (FLOMAX) 0.4 MG CAPS capsule Take 1 capsule (0.4 mg total) by mouth daily. 02/05/19   Azalia Bilis, MD    Family History Family History  Problem Relation Age of Onset  . Heart attack Paternal Grandfather   . Coronary artery disease Other        family history  . Cancer Other        family history    Social History Social History   Tobacco Use  . Smoking status: Former Smoker    Last attempt to quit: 10/30/2008    Years since quitting: 10.2  . Smokeless tobacco: Never Used  Substance Use Topics  . Alcohol use:  Yes    Alcohol/week: 0.0 standard drinks    Comment: rarely  . Drug use: No     Allergies   Celestone [betamethasone]   Review of Systems Review of Systems  All other systems reviewed and are negative.    Physical Exam Updated Vital Signs BP (!) 134/91   Pulse 96   Temp (!) 97.5 F (36.4 C) (Oral)   Resp 16   Ht 5\' 9"  (1.753 m)   Wt 72.6 kg   LMP 01/15/2019   SpO2 99%   BMI 23.63 kg/m   Physical Exam Vitals signs and nursing note reviewed.  Constitutional:      General: She is not in acute distress.    Appearance: She is well-developed.  HENT:     Head: Normocephalic and atraumatic.  Neck:     Musculoskeletal: Normal range of motion.  Cardiovascular:     Rate and Rhythm: Normal rate and regular rhythm.      Heart sounds: Normal heart sounds.  Pulmonary:     Effort: Pulmonary effort is normal.     Breath sounds: Normal breath sounds.  Abdominal:     General: There is no distension.     Palpations: Abdomen is soft.     Tenderness: There is no abdominal tenderness.  Musculoskeletal: Normal range of motion.  Skin:    General: Skin is warm and dry.  Neurological:     Mental Status: She is alert and oriented to person, place, and time.  Psychiatric:        Judgment: Judgment normal.      ED Treatments / Results  Labs (all labs ordered are listed, but only abnormal results are displayed) Labs Reviewed  URINALYSIS, ROUTINE W REFLEX MICROSCOPIC - Abnormal; Notable for the following components:      Result Value   Hgb urine dipstick TRACE (*)    Ketones, ur >80 (*)    All other components within normal limits  URINALYSIS, MICROSCOPIC (REFLEX) - Abnormal; Notable for the following components:   Bacteria, UA FEW (*)    All other components within normal limits  PREGNANCY, URINE    EKG None  Radiology Ct Renal Stone Study  Result Date: 02/05/2019 CLINICAL DATA:  Left-sided abdominal pain EXAM: CT ABDOMEN AND PELVIS WITHOUT CONTRAST TECHNIQUE: Multidetector CT imaging of the abdomen and pelvis was performed following the standard protocol without IV contrast. COMPARISON:  None. FINDINGS: Lower chest: No acute abnormality. Hepatobiliary: No focal liver abnormality is seen. No gallstones, gallbladder wall thickening, or biliary dilatation. Pancreas: Unremarkable. No pancreatic ductal dilatation or surrounding inflammatory changes. Spleen: Normal in size without focal abnormality. Adrenals/Urinary Tract: Adrenal glands are within normal limits. The right kidney demonstrates no renal calculi or obstructive changes. The bladder is well distended. Fullness of the left collecting system and left ureter is noted which corresponds to a 1-2 mm stone at the left UVJ. Stomach/Bowel: The appendix is within  normal limits. No obstructive or inflammatory changes of the large or small bowel are seen. The stomach is within normal limits. Vascular/Lymphatic: No significant vascular findings are present. No enlarged abdominal or pelvic lymph nodes. Reproductive: Uterus and bilateral adnexa are unremarkable. Changes of tubal ligation are noted. Other: No abdominal wall hernia or abnormality. No abdominopelvic ascites. Musculoskeletal: No acute or significant osseous findings. IMPRESSION: 1-2 mm left UVJ stone with mild obstructive change. Electronically Signed   By: Alcide CleverMark  Lukens M.D.   On: 02/05/2019 08:53    Procedures Procedures (including critical care time)  Medications Ordered in ED Medications  morphine 4 MG/ML injection 4 mg (4 mg Intravenous Given 02/05/19 0732)  ondansetron (ZOFRAN) injection 4 mg (4 mg Intravenous Given 02/05/19 0732)  ketorolac (TORADOL) 30 MG/ML injection 30 mg (30 mg Intravenous Given 02/05/19 0732)  sodium chloride 0.9 % bolus 1,000 mL (1,000 mLs Intravenous New Bag/Given 02/05/19 0735)  HYDROmorphone (DILAUDID) injection 1 mg (1 mg Intravenous Given 02/05/19 0917)  acetaminophen (TYLENOL) tablet 1,000 mg (1,000 mg Oral Given 02/05/19 0917)     Initial Impression / Assessment and Plan / ED Course  I have reviewed the triage vital signs and the nursing notes.  Pertinent labs & imaging results that were available during my care of the patient were reviewed by me and considered in my medical decision making (see chart for details).        Pain controlled in the emergency department.  Left ureteral stone.  Standard stone precautions given.  I personally viewed the patient's CT imaging which demonstrates small distal left ureteral stone.  No other obvious pathology found.  Close follow-up with primary care physician outpatient referral to urology.  Patient understands to return to the emergency department for new or worsening symptoms  Final Clinical Impressions(s) / ED Diagnoses    Final diagnoses:  Left ureteral stone    ED Discharge Orders         Ordered    oxyCODONE-acetaminophen (PERCOCET/ROXICET) 5-325 MG tablet  Every 4 hours PRN     02/05/19 0933    ondansetron (ZOFRAN ODT) 8 MG disintegrating tablet  Every 8 hours PRN     02/05/19 0933    ibuprofen (ADVIL) 600 MG tablet  Every 8 hours PRN     02/05/19 0933    tamsulosin (FLOMAX) 0.4 MG CAPS capsule  Daily     02/05/19 0933           Azalia Bilis, MD 02/05/19 [phone]

## 2020-06-15 ENCOUNTER — Telehealth: Payer: Self-pay | Admitting: Cardiology

## 2020-06-15 NOTE — Telephone Encounter (Signed)
Pt advised that she may need to call her PCP but she says that she does not have a PCP to get something in writing to state she is exempt from the COVID vaccine. I offered her the [facility] number to call to help her find a PCP but she declined and says that she will not get one in the [facility] system if they all believe in the vaccine.

## 2020-06-15 NOTE — Telephone Encounter (Signed)
Patient is requesting a medical exception from the covid vaccine.

## 2021-11-30 ENCOUNTER — Other Ambulatory Visit: Payer: Self-pay | Admitting: Obstetrics & Gynecology

## 2021-11-30 ENCOUNTER — Other Ambulatory Visit: Payer: Self-pay | Admitting: Family Medicine

## 2021-11-30 ENCOUNTER — Ambulatory Visit
Admission: RE | Admit: 2021-11-30 | Discharge: 2021-11-30 | Disposition: A | Discharge status: Home / Self Care | Payer: No Typology Code available for payment source | Source: Ambulatory Visit | Attending: Obstetrics & Gynecology | Admitting: Obstetrics & Gynecology

## 2021-11-30 DIAGNOSIS — T193XXA Foreign body in uterus, initial encounter: Secondary | ICD-10-CM
